# Patient Record
Sex: Male | Born: 1960 | Race: White | Hispanic: No | Marital: Married | Smoking: Never smoker
Health system: Southern US, Community
[De-identification: ages and names within clinical notes are randomized; demographics above are authoritative.]

## PROBLEM LIST (undated history)

## (undated) DIAGNOSIS — G4730 Sleep apnea, unspecified: Secondary | ICD-10-CM

## (undated) DIAGNOSIS — M1990 Unspecified osteoarthritis, unspecified site: Secondary | ICD-10-CM

## (undated) DIAGNOSIS — F419 Anxiety disorder, unspecified: Secondary | ICD-10-CM

## (undated) DIAGNOSIS — Z8739 Personal history of other diseases of the musculoskeletal system and connective tissue: Secondary | ICD-10-CM

## (undated) DIAGNOSIS — B019 Varicella without complication: Secondary | ICD-10-CM

## (undated) DIAGNOSIS — E785 Hyperlipidemia, unspecified: Secondary | ICD-10-CM

## (undated) HISTORY — PX: TOTAL HIP ARTHROPLASTY: SHX124

## (undated) HISTORY — PX: JOINT REPLACEMENT: SHX530

## (undated) HISTORY — PX: COLONOSCOPY: SHX174

## (undated) HISTORY — PX: KNEE SURGERY: SHX244

## (undated) HISTORY — PX: OTHER SURGICAL HISTORY: SHX169

---

## 2005-03-24 ENCOUNTER — Ambulatory Visit: Payer: Self-pay

## 2005-04-01 ENCOUNTER — Ambulatory Visit: Payer: Self-pay

## 2005-07-20 ENCOUNTER — Ambulatory Visit: Payer: Self-pay

## 2005-09-27 ENCOUNTER — Emergency Department: Payer: Self-pay | Admitting: Emergency Medicine

## 2006-01-21 ENCOUNTER — Ambulatory Visit: Payer: Self-pay | Admitting: Nurse Practitioner

## 2009-05-15 ENCOUNTER — Ambulatory Visit: Payer: Self-pay | Admitting: Internal Medicine

## 2010-05-22 ENCOUNTER — Ambulatory Visit: Payer: Self-pay | Admitting: Internal Medicine

## 2010-06-26 ENCOUNTER — Ambulatory Visit: Payer: Self-pay | Admitting: Internal Medicine

## 2015-10-24 ENCOUNTER — Encounter: Payer: Self-pay | Admitting: *Deleted

## 2015-10-27 ENCOUNTER — Encounter
Admission: RE | Disposition: A | Discharge status: Home / Self Care | Payer: Self-pay | Source: Ambulatory Visit | Attending: Gastroenterology

## 2015-10-27 ENCOUNTER — Ambulatory Visit
Admission: RE | Admit: 2015-10-27 | Discharge: 2015-10-27 | Disposition: A | Discharge status: Home / Self Care | Payer: BC Managed Care – PPO | Source: Ambulatory Visit | Attending: Gastroenterology | Admitting: Gastroenterology

## 2015-10-27 ENCOUNTER — Encounter: Payer: Self-pay | Admitting: *Deleted

## 2015-10-27 ENCOUNTER — Ambulatory Visit: Payer: BC Managed Care – PPO | Admitting: Anesthesiology

## 2015-10-27 DIAGNOSIS — G4730 Sleep apnea, unspecified: Secondary | ICD-10-CM | POA: Insufficient documentation

## 2015-10-27 DIAGNOSIS — Z1211 Encounter for screening for malignant neoplasm of colon: Secondary | ICD-10-CM | POA: Diagnosis not present

## 2015-10-27 DIAGNOSIS — Z96649 Presence of unspecified artificial hip joint: Secondary | ICD-10-CM | POA: Diagnosis not present

## 2015-10-27 DIAGNOSIS — F419 Anxiety disorder, unspecified: Secondary | ICD-10-CM | POA: Diagnosis not present

## 2015-10-27 DIAGNOSIS — E785 Hyperlipidemia, unspecified: Secondary | ICD-10-CM | POA: Diagnosis not present

## 2015-10-27 DIAGNOSIS — Z6838 Body mass index (BMI) 38.0-38.9, adult: Secondary | ICD-10-CM | POA: Insufficient documentation

## 2015-10-27 DIAGNOSIS — Z79899 Other long term (current) drug therapy: Secondary | ICD-10-CM | POA: Insufficient documentation

## 2015-10-27 HISTORY — DX: Hyperlipidemia, unspecified: E78.5

## 2015-10-27 HISTORY — DX: Sleep apnea, unspecified: G47.30

## 2015-10-27 HISTORY — DX: Unspecified osteoarthritis, unspecified site: M19.90

## 2015-10-27 HISTORY — DX: Anxiety disorder, unspecified: F41.9

## 2015-10-27 HISTORY — DX: Varicella without complication: B01.9

## 2015-10-27 HISTORY — PX: COLONOSCOPY WITH PROPOFOL: SHX5780

## 2015-10-27 HISTORY — DX: Personal history of other diseases of the musculoskeletal system and connective tissue: Z87.39

## 2015-10-27 SURGERY — COLONOSCOPY WITH PROPOFOL
Anesthesia: General

## 2015-10-27 MED ORDER — SODIUM CHLORIDE 0.9 % IV SOLN
INTRAVENOUS | Status: DC
  Administered 2015-10-27: 1000 mL via INTRAVENOUS

## 2015-10-27 MED ORDER — MIDAZOLAM HCL 2 MG/2ML IJ SOLN
INTRAMUSCULAR | Status: DC | PRN
  Administered 2015-10-27: 1 mg via INTRAVENOUS

## 2015-10-27 MED ORDER — SODIUM CHLORIDE 0.9 % IV SOLN
INTRAVENOUS | Status: DC
  Administered 2015-10-27: 10:00:00 via INTRAVENOUS

## 2015-10-27 MED ORDER — FENTANYL CITRATE (PF) 100 MCG/2ML IJ SOLN
INTRAMUSCULAR | Status: DC | PRN
  Administered 2015-10-27: 50 ug via INTRAVENOUS

## 2015-10-27 MED ORDER — PROPOFOL 500 MG/50ML IV EMUL
INTRAVENOUS | Status: DC | PRN
  Administered 2015-10-27: 100 ug/kg/min via INTRAVENOUS

## 2015-10-27 NOTE — Anesthesia Postprocedure Evaluation (Signed)
Anesthesia Post Note  Patient: Terry Maldonado  Procedure(s) Performed: Procedure(s) (LRB): COLONOSCOPY WITH PROPOFOL (N/A)  Patient location during evaluation: PACU Anesthesia Type: General Level of consciousness: awake Pain management: satisfactory to patient Vital Signs Assessment: post-procedure vital signs reviewed and stable Respiratory status: nonlabored ventilation Cardiovascular status: stable Anesthetic complications: no    Last Vitals:  Filed Vitals:   10/27/15 1036 10/27/15 1040  BP: 107/59 99/57  Pulse: 68 66  Temp: 36.9 C   Resp: 17 16    Last Pain: There were no vitals filed for this visit.               VAN STAVEREN,Ramiah Helfrich

## 2015-10-27 NOTE — H&P (Signed)
    Primary Care Physician:  Lynnea FerrierBERT J KLEIN III, MD Primary Gastroenterologist:  Dr. Bluford Kaufmannh  Pre-Procedure History & Physical: HPI:  Terry Phoenixhillip E Vigilante is a 55 y.o. male is here for an colonoscopy.   Past Medical History  Diagnosis Date  . Hx of degenerative disc disease   . Hyperlipidemia   . Chicken pox   . Sleep apnea   . Anxiety   . Arthritis     Past Surgical History  Procedure Laterality Date  . Knee surgery    . Colonoscopy    . Total hip arthroplasty    . Wisdom teeth removal    . Joint replacement    . Leg venous ablation      Prior to Admission medications   Medication Sig Start Date End Date Taking? Authorizing Provider  busPIRone (BUSPAR) 7.5 MG tablet Take 7.5 mg by mouth 2 (two) times daily.   Yes Historical Provider, MD  escitalopram (LEXAPRO) 10 MG tablet Take 10 mg by mouth daily.   Yes Historical Provider, MD  ibuprofen (ADVIL,MOTRIN) 800 MG tablet Take 800 mg by mouth every 8 (eight) hours as needed.   Yes Historical Provider, MD  Multiple Vitamin (MULTIVITAMIN) tablet Take 1 tablet by mouth daily.   Yes Historical Provider, MD  Omega 3 1000 MG CAPS Take 360-1,200 mg by mouth 1 day or 1 dose.   Yes Historical Provider, MD  traMADol-acetaminophen (ULTRACET) 37.5-325 MG tablet Take 1 tablet by mouth every 6 (six) hours as needed.   Yes Historical Provider, MD    Allergies as of 10/09/2015  . (Not on File)    History reviewed. No pertinent family history.  Social History   Social History  . Marital Status: Married    Spouse Name: N/A  . Number of Children: N/A  . Years of Education: N/A   Occupational History  . Not on file.   Social History Main Topics  . Smoking status: Never Smoker   . Smokeless tobacco: Not on file  . Alcohol Use: Not on file  . Drug Use: Not on file  . Sexual Activity: Not on file   Other Topics Concern  . Not on file   Social History Narrative    Review of Systems: See HPI, otherwise negative ROS  Physical  Exam: BP 142/89 mmHg  Pulse 53  Temp(Src) 97.7 F (36.5 C) (Tympanic)  Resp 18  Ht 5\' 8"  (1.727 m)  Wt 115.667 kg (255 lb)  BMI 38.78 kg/m2  SpO2 98% General:   Alert,  pleasant and cooperative in NAD Head:  Normocephalic and atraumatic. Neck:  Supple; no masses or thyromegaly. Lungs:  Clear throughout to auscultation.    Heart:  Regular rate and rhythm. Abdomen:  Soft, nontender and nondistended. Normal bowel sounds, without guarding, and without rebound.   Neurologic:  Alert and  oriented x4;  grossly normal neurologically.  Impression/Plan: Terry Maldonado is here for an colonoscopy to be performed for screening.  Risks, benefits, limitations, and alternatives regarding colonoscopy have been reviewed with the patient.  Questions have been answered.  All parties agreeable.   Darianna Amy, Ezzard StandingPAUL Y, MD  10/27/2015, 10:01 AM

## 2015-10-27 NOTE — Anesthesia Preprocedure Evaluation (Signed)
Anesthesia Evaluation  Patient identified by MRN, date of birth, ID band Patient awake    Reviewed: Allergy & Precautions, NPO status , Patient's Chart, lab work & pertinent test results  Airway Mallampati: III       Dental  (+) Teeth Intact   Pulmonary sleep apnea and Continuous Positive Airway Pressure Ventilation ,     + decreased breath sounds      Cardiovascular Exercise Tolerance: Good  Rhythm:Regular     Neuro/Psych Anxiety    GI/Hepatic negative GI ROS, Neg liver ROS,   Endo/Other  Morbid obesity  Renal/GU negative Renal ROS     Musculoskeletal   Abdominal (+) + obese,   Peds  Hematology   Anesthesia Other Findings   Reproductive/Obstetrics                             Anesthesia Physical Anesthesia Plan  ASA: III  Anesthesia Plan: General   Post-op Pain Management:    Induction: Intravenous  Airway Management Planned: Natural Airway and Nasal Cannula  Additional Equipment:   Intra-op Plan:   Post-operative Plan:   Informed Consent: I have reviewed the patients History and Physical, chart, labs and discussed the procedure including the risks, benefits and alternatives for the proposed anesthesia with the patient or authorized representative who has indicated his/her understanding and acceptance.     Plan Discussed with: CRNA  Anesthesia Plan Comments:         Anesthesia Quick Evaluation

## 2015-10-27 NOTE — Anesthesia Procedure Notes (Signed)
Performed by: Rindi Beechy Oxygen Delivery Method: Nasal cannula     

## 2015-10-27 NOTE — Transfer of Care (Signed)
Immediate Anesthesia Transfer of Care Note  Patient: Terry Maldonado  Procedure(s) Performed: Procedure(s): COLONOSCOPY WITH PROPOFOL (N/A)  Patient Location: PACU  Anesthesia Type:General  Level of Consciousness: awake and alert   Airway & Oxygen Therapy: Patient Spontanous Breathing  Post-op Assessment: Report given to RN  Post vital signs: Reviewed and stable  Last Vitals:  Filed Vitals:   10/27/15 0955 10/27/15 1036  BP: 142/89 138/75  Pulse: 53 80  Temp: 36.5 C 37 C  Resp: 18 16    Complications: No apparent anesthesia complications

## 2015-10-27 NOTE — Op Note (Signed)
[facility] Gastroenterology Patient Name: Terry Falcohillip Maser Procedure Date: 10/27/2015 10:14 AM MRN: [id number] Account #: [id number] Date of Birth: 03/25/1961 Admit Type: Outpatient Age: 55 [facility] RO[facility] 4 Gender: Male Note Status: Finalized Procedure:            Colonoscopy Indications:          Screening for colorectal malignant neoplasm Providers:            Ezzard StandingPaul Y. Bluford Kaufmannh, MD Referring MD:         Daniel NonesBert Klein, MD (Referring MD) Medicines:            Monitored Anesthesia Care Complications:        No immediate complications. Procedure:            Pre-Anesthesia Assessment:                       - Prior to the procedure, a History and Physical was                        performed, and patient medications, allergies and                        sensitivities were reviewed. The patient's tolerance of                        previous anesthesia was reviewed.                       - The risks and benefits of the procedure and the                        sedation options and risks were discussed with the                        patient. All questions were answered and informed                        consent was obtained.                       - After reviewing the risks and benefits, the patient                        was deemed in satisfactory condition to undergo the                        procedure.                       After obtaining informed consent, the colonoscope was                        passed under direct vision. Throughout the procedure,                        the patient's blood pressure, pulse, and oxygen                        saturations were monitored continuously. The  Colonoscope was introduced through the anus and                        advanced to the the cecum, identified by appendiceal                        orifice and ileocecal valve. The colonoscopy was                        performed without difficulty. The patient  tolerated the                        procedure well. The quality of the bowel preparation                        was good. Findings:      The colon (entire examined portion) appeared normal. Impression:           - The entire examined colon is normal.                       - No specimens collected. Recommendation:       - Discharge patient to home.                       - Repeat colonoscopy in 10 years for surveillance.                       - The findings and recommendations were discussed with                        the patient. Procedure Code(s):    --- Professional ---                       [phone], Colonoscopy, flexible; diagnostic, including                        collection of specimen(s) by brushing or washing, when                        performed (separate procedure) Diagnosis Code(s):    --- Professional ---                       Z12.11, Encounter for screening for malignant neoplasm                        of colon CPT copyright 2016 American Medical Association. All rights reserved. The codes documented in this report are preliminary and upon coder review may  be revised to meet current compliance requirements. Wallace Cullens, MD 10/27/2015 10:34:13 AM This report has been signed electronically. Number of Addenda: 0 Note Initiated On: 10/27/2015 10:14 AM Scope Withdrawal Time: 0 hours 6 minutes 32 seconds  Total Procedure Duration: 0 hours 9 minutes 2 seconds       [facility]

## 2015-10-28 ENCOUNTER — Encounter: Payer: Self-pay | Admitting: Gastroenterology

## 2016-07-21 ENCOUNTER — Other Ambulatory Visit: Payer: BC Managed Care – PPO

## 2016-07-21 ENCOUNTER — Encounter
Admission: RE | Admit: 2016-07-21 | Discharge: 2016-07-21 | Disposition: A | Discharge status: Home / Self Care | Payer: BC Managed Care – PPO | Source: Ambulatory Visit | Attending: Orthopedic Surgery | Admitting: Orthopedic Surgery

## 2016-07-21 DIAGNOSIS — Z01818 Encounter for other preprocedural examination: Secondary | ICD-10-CM | POA: Insufficient documentation

## 2016-07-21 DIAGNOSIS — M1612 Unilateral primary osteoarthritis, left hip: Secondary | ICD-10-CM | POA: Diagnosis not present

## 2016-07-21 LAB — SEDIMENTATION RATE: SED RATE: 5 mm/h (ref 0–20)

## 2016-07-21 LAB — TYPE AND SCREEN
ABO/RH(D): O POS
Antibody Screen: NEGATIVE

## 2016-07-21 LAB — APTT: APTT: 30 s (ref 24–36)

## 2016-07-21 LAB — SURGICAL PCR SCREEN
MRSA, PCR: NEGATIVE
STAPHYLOCOCCUS AUREUS: NEGATIVE

## 2016-07-21 LAB — PROTIME-INR
INR: 0.99
PROTHROMBIN TIME: 13.1 s (ref 11.4–15.2)

## 2016-07-21 NOTE — Patient Instructions (Signed)
  Your procedure is scheduled on: Thursday Dec.14, 2017. Report to Same Day Surgery. To find out your arrival time please call [phone] between 1PM - 3PM on Wednesday Dec. 13, 2017.  Remember: Instructions that are not followed completely may result in serious medical risk, up to and including death, or upon the discretion of your surgeon and anesthesiologist your surgery may need to be rescheduled.    _x___ 1. Do not eat food or drink liquids after midnight. No gum chewing or hard candies.     ____ 2. No Alcohol for 24 hours before or after surgery.   ____ 3. Bring all medications with you on the day of surgery if instructed.    __x__ 4. Notify your doctor if there is any change in your medical condition     (cold, fever, infections).    _____ 5. No smoking 24 hours prior to surgery.     Do not wear jewelry, make-up, hairpins, clips or nail polish.  Do not wear lotions, powders, or perfumes.   Do not shave 48 hours prior to surgery. Men may shave face and neck.  Do not bring valuables to the hospital.    [facility] is not responsible for any belongings or valuables.               Contacts, dentures or bridgework may not be worn into surgery.  Leave your suitcase in the car. After surgery it may be brought to your room.  For patients admitted to the hospital, discharge time is determined by your treatment team.   Patients discharged the day of surgery will not be allowed to drive home.    Please read over the following fact sheets that you were given:   [facility] Preparing for [facility]  _x___ Take these medicines the morning of surgery with A SIP OF WATER:    1. escitalopram (LEXAPRO)    ____ Fleet Enema (as directed)   __x__ Use CHG Soap as directed on instruction sheet  ____ Use inhalers on the day of surgery and bring to hospital day of surgery  ____ Stop metformin 2 days prior to surgery    ____ Take 1/2 of usual insulin dose the night before surgery and none  on the morning of surgery.   _x___ Stop aspirin 7 days prior to surgery.  __x__ Stop Anti-inflammatories such as Advil, Aleve, Ibuprofen, Motrin, Naproxen, Naprosyn, Goodies powders or aspirin products 7 days prior to surgery.. OK to  take Tylenol or Tramadol.   __x__ Stop supplements:Omega 3  until after surgery.    _x___ Bring C-Pap to the hospital.

## 2016-07-21 NOTE — Pre-Procedure Instructions (Signed)
See EKG on chart Sinus Huston FoleyBrady.

## 2016-07-21 NOTE — Pre-Procedure Instructions (Signed)
Pt saw his PCP last week at [facility], had CBC, Met B, UA and EKG done.  Spoke with Mendel Ryder at Dr. Rudene Christians office to report above, OK not to repeat above tests at today's PAT visit and use results from last week for upcoming surgery.

## 2016-07-22 LAB — URINALYSIS, ROUTINE W REFLEX MICROSCOPIC
BILIRUBIN URINE: NEGATIVE
Bacteria, UA: NONE SEEN
GLUCOSE, UA: NEGATIVE mg/dL
HGB URINE DIPSTICK: NEGATIVE
Ketones, ur: NEGATIVE mg/dL
LEUKOCYTES UA: NEGATIVE
NITRITE: NEGATIVE
PH: 5 (ref 5.0–8.0)
Protein, ur: 30 mg/dL — AB
SPECIFIC GRAVITY, URINE: 1.028 (ref 1.005–1.030)
Squamous Epithelial / LPF: NONE SEEN

## 2016-07-22 LAB — URINE CULTURE: CULTURE: NO GROWTH

## 2016-07-29 ENCOUNTER — Inpatient Hospital Stay: Payer: BC Managed Care – PPO | Admitting: Anesthesiology

## 2016-07-29 ENCOUNTER — Encounter: Payer: Self-pay | Admitting: *Deleted

## 2016-07-29 ENCOUNTER — Inpatient Hospital Stay
Admission: RE | Admit: 2016-07-29 | Discharge: 2016-07-31 | DRG: 470 | Disposition: A | Discharge status: Home Health | Payer: BC Managed Care – PPO | Source: Ambulatory Visit | Attending: Orthopedic Surgery | Admitting: Orthopedic Surgery

## 2016-07-29 ENCOUNTER — Inpatient Hospital Stay: Payer: BC Managed Care – PPO

## 2016-07-29 ENCOUNTER — Encounter
Admission: RE | Disposition: A | Discharge status: Home Health | Payer: Self-pay | Source: Ambulatory Visit | Attending: Orthopedic Surgery

## 2016-07-29 DIAGNOSIS — F17220 Nicotine dependence, chewing tobacco, uncomplicated: Secondary | ICD-10-CM | POA: Diagnosis present

## 2016-07-29 DIAGNOSIS — M6281 Muscle weakness (generalized): Secondary | ICD-10-CM

## 2016-07-29 DIAGNOSIS — G8918 Other acute postprocedural pain: Secondary | ICD-10-CM

## 2016-07-29 DIAGNOSIS — R262 Difficulty in walking, not elsewhere classified: Secondary | ICD-10-CM

## 2016-07-29 DIAGNOSIS — G4730 Sleep apnea, unspecified: Secondary | ICD-10-CM | POA: Diagnosis present

## 2016-07-29 DIAGNOSIS — F419 Anxiety disorder, unspecified: Secondary | ICD-10-CM | POA: Diagnosis present

## 2016-07-29 DIAGNOSIS — E785 Hyperlipidemia, unspecified: Secondary | ICD-10-CM | POA: Diagnosis present

## 2016-07-29 DIAGNOSIS — D62 Acute posthemorrhagic anemia: Secondary | ICD-10-CM | POA: Diagnosis not present

## 2016-07-29 DIAGNOSIS — Z888 Allergy status to other drugs, medicaments and biological substances status: Secondary | ICD-10-CM

## 2016-07-29 DIAGNOSIS — M1612 Unilateral primary osteoarthritis, left hip: Secondary | ICD-10-CM | POA: Diagnosis present

## 2016-07-29 DIAGNOSIS — E876 Hypokalemia: Secondary | ICD-10-CM | POA: Diagnosis present

## 2016-07-29 DIAGNOSIS — Z419 Encounter for procedure for purposes other than remedying health state, unspecified: Secondary | ICD-10-CM

## 2016-07-29 DIAGNOSIS — R509 Fever, unspecified: Secondary | ICD-10-CM | POA: Diagnosis not present

## 2016-07-29 HISTORY — PX: TOTAL HIP ARTHROPLASTY: SHX124

## 2016-07-29 LAB — CBC
HEMATOCRIT: 39.3 % — AB (ref 40.0–52.0)
Hemoglobin: 13.5 g/dL (ref 13.0–18.0)
MCH: 30 pg (ref 26.0–34.0)
MCHC: 34.3 g/dL (ref 32.0–36.0)
MCV: 87.3 fL (ref 80.0–100.0)
PLATELETS: 185 10*3/uL (ref 150–440)
RBC: 4.51 MIL/uL (ref 4.40–5.90)
RDW: 13.3 % (ref 11.5–14.5)
WBC: 19.9 10*3/uL — AB (ref 3.8–10.6)

## 2016-07-29 LAB — ABO/RH: ABO/RH(D): O POS

## 2016-07-29 LAB — CREATININE, SERUM: CREATININE: 0.9 mg/dL (ref 0.61–1.24)

## 2016-07-29 SURGERY — ARTHROPLASTY, HIP, TOTAL, ANTERIOR APPROACH
Anesthesia: General | Site: Hip | Laterality: Left | Wound class: Clean

## 2016-07-29 MED ORDER — ACETAMINOPHEN 10 MG/ML IV SOLN
INTRAVENOUS | Status: AC
  Administered 2016-07-29: 1000 mg via INTRAVENOUS
  Filled 2016-07-29: qty 100

## 2016-07-29 MED ORDER — DIPHENHYDRAMINE HCL 12.5 MG/5ML PO ELIX: 12.5000 mg | ORAL_SOLUTION | ORAL | Status: DC | PRN

## 2016-07-29 MED ORDER — SODIUM CHLORIDE 0.9 % IV SOLN
INTRAVENOUS | Status: DC
Start: 2016-07-29 — End: 2016-07-31
  Administered 2016-07-29 (×2): via INTRAVENOUS

## 2016-07-29 MED ORDER — FENTANYL CITRATE (PF) 100 MCG/2ML IJ SOLN
INTRAMUSCULAR | Status: DC | PRN
  Administered 2016-07-29: 100 ug via INTRAVENOUS

## 2016-07-29 MED ORDER — DEXTROSE 5 % IV SOLN
3.0000 g | Freq: Four times a day (QID) | INTRAVENOUS | Status: AC
  Administered 2016-07-29 – 2016-07-30 (×3): 3 g via INTRAVENOUS
  Filled 2016-07-29 (×3): qty 3000

## 2016-07-29 MED ORDER — DEXTROSE 5 % IV SOLN
3.0000 g | Freq: Once | INTRAVENOUS | Status: DC
  Filled 2016-07-29: qty 3000

## 2016-07-29 MED ORDER — SUGAMMADEX SODIUM 200 MG/2ML IV SOLN
INTRAVENOUS | Status: DC | PRN
  Administered 2016-07-29: 200 mg via INTRAVENOUS

## 2016-07-29 MED ORDER — FENTANYL CITRATE (PF) 100 MCG/2ML IJ SOLN
INTRAMUSCULAR | Status: AC
  Filled 2016-07-29: qty 2

## 2016-07-29 MED ORDER — ZOLPIDEM TARTRATE 5 MG PO TABS: 5.0000 mg | ORAL_TABLET | Freq: Every evening | ORAL | Status: DC | PRN

## 2016-07-29 MED ORDER — DEXAMETHASONE SODIUM PHOSPHATE 10 MG/ML IJ SOLN
INTRAMUSCULAR | Status: DC | PRN
  Administered 2016-07-29: 4 mg via INTRAVENOUS

## 2016-07-29 MED ORDER — FAMOTIDINE 20 MG PO TABS
ORAL_TABLET | ORAL | Status: AC
  Filled 2016-07-29: qty 1

## 2016-07-29 MED ORDER — BUPIVACAINE-EPINEPHRINE (PF) 0.25% -1:200000 IJ SOLN
INTRAMUSCULAR | Status: AC
  Filled 2016-07-29: qty 30

## 2016-07-29 MED ORDER — ADULT MULTIVITAMIN W/MINERALS CH
1.0000 | ORAL_TABLET | Freq: Every day | ORAL | Status: DC
  Administered 2016-07-29 – 2016-07-31 (×3): 1 via ORAL
  Filled 2016-07-29 (×3): qty 1

## 2016-07-29 MED ORDER — ROCURONIUM BROMIDE 100 MG/10ML IV SOLN
INTRAVENOUS | Status: DC | PRN
  Administered 2016-07-29: 10 mg via INTRAVENOUS
  Administered 2016-07-29: 20 mg via INTRAVENOUS

## 2016-07-29 MED ORDER — EPHEDRINE SULFATE 50 MG/ML IJ SOLN
INTRAMUSCULAR | Status: DC | PRN
Start: 2016-07-29 — End: 2016-07-29
  Administered 2016-07-29: 15 mg via INTRAVENOUS
  Administered 2016-07-29 (×3): 10 mg via INTRAVENOUS

## 2016-07-29 MED ORDER — MORPHINE SULFATE (PF) 2 MG/ML IV SOLN
2.0000 mg | INTRAVENOUS | Status: DC | PRN
  Administered 2016-07-29 – 2016-07-30 (×2): 2 mg via INTRAVENOUS
  Filled 2016-07-29 (×2): qty 1

## 2016-07-29 MED ORDER — OMEGA-3-ACID ETHYL ESTERS 1 G PO CAPS
1000.0000 mg | ORAL_CAPSULE | ORAL | Status: DC
  Administered 2016-07-31: 1000 mg via ORAL
  Filled 2016-07-29: qty 1

## 2016-07-29 MED ORDER — FENTANYL CITRATE (PF) 100 MCG/2ML IJ SOLN
25.0000 ug | INTRAMUSCULAR | Status: DC | PRN
  Administered 2016-07-29 (×5): 25 ug via INTRAVENOUS

## 2016-07-29 MED ORDER — LIDOCAINE HCL (CARDIAC) 20 MG/ML IV SOLN
INTRAVENOUS | Status: DC | PRN
  Administered 2016-07-29: 100 mg via INTRAVENOUS

## 2016-07-29 MED ORDER — ALUM & MAG HYDROXIDE-SIMETH 200-200-20 MG/5ML PO SUSP: 30.0000 mL | ORAL | Status: DC | PRN

## 2016-07-29 MED ORDER — PROMETHAZINE HCL 25 MG/ML IJ SOLN: 6.2500 mg | INTRAMUSCULAR | Status: DC | PRN

## 2016-07-29 MED ORDER — ASPIRIN EC 81 MG PO TBEC
81.0000 mg | DELAYED_RELEASE_TABLET | Freq: Every day | ORAL | Status: DC
  Administered 2016-07-29 – 2016-07-31 (×3): 81 mg via ORAL
  Filled 2016-07-29 (×3): qty 1

## 2016-07-29 MED ORDER — FAMOTIDINE 20 MG PO TABS
20.0000 mg | ORAL_TABLET | Freq: Once | ORAL | Status: AC
  Administered 2016-07-29: 20 mg via ORAL

## 2016-07-29 MED ORDER — BUPIVACAINE-EPINEPHRINE 0.25% -1:200000 IJ SOLN
INTRAMUSCULAR | Status: DC | PRN
  Administered 2016-07-29: 30 mL

## 2016-07-29 MED ORDER — METOCLOPRAMIDE HCL 5 MG/ML IJ SOLN: 5.0000 mg | Freq: Three times a day (TID) | INTRAMUSCULAR | Status: DC | PRN

## 2016-07-29 MED ORDER — ONDANSETRON HCL 4 MG/2ML IJ SOLN: 4.0000 mg | Freq: Four times a day (QID) | INTRAMUSCULAR | Status: DC | PRN

## 2016-07-29 MED ORDER — GENTAMICIN SULFATE 40 MG/ML IJ SOLN
INTRAMUSCULAR | Status: AC
Start: 2016-07-29 — End: 2016-07-29
  Filled 2016-07-29: qty 2

## 2016-07-29 MED ORDER — ONDANSETRON HCL 4 MG/2ML IJ SOLN
INTRAMUSCULAR | Status: DC | PRN
  Administered 2016-07-29: 4 mg via INTRAVENOUS

## 2016-07-29 MED ORDER — MAGNESIUM CITRATE PO SOLN
1.0000 | Freq: Once | ORAL | Status: DC | PRN
  Filled 2016-07-29: qty 296

## 2016-07-29 MED ORDER — OXYCODONE HCL 5 MG PO TABS
5.0000 mg | ORAL_TABLET | ORAL | Status: DC | PRN
  Administered 2016-07-29 – 2016-07-31 (×10): 10 mg via ORAL
  Filled 2016-07-29 (×10): qty 2

## 2016-07-29 MED ORDER — NEOMYCIN-POLYMYXIN B GU 40-200000 IR SOLN
Status: DC | PRN
  Administered 2016-07-29: 4 mL

## 2016-07-29 MED ORDER — BISACODYL 10 MG RE SUPP: 10.0000 mg | Freq: Every day | RECTAL | Status: DC | PRN

## 2016-07-29 MED ORDER — MAGNESIUM HYDROXIDE 400 MG/5ML PO SUSP
30.0000 mL | Freq: Every day | ORAL | Status: DC | PRN
  Administered 2016-07-30: 30 mL via ORAL
  Filled 2016-07-29: qty 30

## 2016-07-29 MED ORDER — TRANEXAMIC ACID 1000 MG/10ML IV SOLN
1000.0000 mg | INTRAVENOUS | Status: AC
  Administered 2016-07-29: 1000 mg via INTRAVENOUS
  Filled 2016-07-29: qty 10

## 2016-07-29 MED ORDER — LACTATED RINGERS IV SOLN
INTRAVENOUS | Status: DC
  Administered 2016-07-29 (×2): via INTRAVENOUS

## 2016-07-29 MED ORDER — DOCUSATE SODIUM 100 MG PO CAPS
100.0000 mg | ORAL_CAPSULE | Freq: Two times a day (BID) | ORAL | Status: DC
  Administered 2016-07-29 – 2016-07-31 (×5): 100 mg via ORAL
  Filled 2016-07-29 (×5): qty 1

## 2016-07-29 MED ORDER — HYDROMORPHONE HCL 1 MG/ML IJ SOLN
INTRAMUSCULAR | Status: DC | PRN
  Administered 2016-07-29: .6 mg via INTRAVENOUS
  Administered 2016-07-29: .4 mg via INTRAVENOUS
  Administered 2016-07-29: .5 mg via INTRAVENOUS

## 2016-07-29 MED ORDER — NEOMYCIN-POLYMYXIN B GU 40-200000 IR SOLN
Status: AC
  Filled 2016-07-29: qty 4

## 2016-07-29 MED ORDER — ACETAMINOPHEN 650 MG RE SUPP: 650.0000 mg | Freq: Four times a day (QID) | RECTAL | Status: DC | PRN

## 2016-07-29 MED ORDER — ENOXAPARIN SODIUM 40 MG/0.4ML ~~LOC~~ SOLN
40.0000 mg | SUBCUTANEOUS | Status: DC
  Administered 2016-07-30 – 2016-07-31 (×2): 40 mg via SUBCUTANEOUS
  Filled 2016-07-29 (×2): qty 0.4

## 2016-07-29 MED ORDER — FENTANYL CITRATE (PF) 100 MCG/2ML IJ SOLN
INTRAMUSCULAR | Status: AC
  Administered 2016-07-29: 25 ug via INTRAVENOUS
  Filled 2016-07-29: qty 2

## 2016-07-29 MED ORDER — PHENOL 1.4 % MT LIQD
1.0000 | OROMUCOSAL | Status: DC | PRN
  Filled 2016-07-29: qty 177

## 2016-07-29 MED ORDER — ONDANSETRON HCL 4 MG PO TABS: 4.0000 mg | ORAL_TABLET | Freq: Four times a day (QID) | ORAL | Status: DC | PRN

## 2016-07-29 MED ORDER — ACETAMINOPHEN 325 MG PO TABS
650.0000 mg | ORAL_TABLET | Freq: Four times a day (QID) | ORAL | Status: DC | PRN
  Administered 2016-07-30: 650 mg via ORAL

## 2016-07-29 MED ORDER — MENTHOL 3 MG MT LOZG
1.0000 | LOZENGE | OROMUCOSAL | Status: DC | PRN
  Filled 2016-07-29: qty 9

## 2016-07-29 MED ORDER — ESCITALOPRAM OXALATE 10 MG PO TABS
10.0000 mg | ORAL_TABLET | Freq: Every day | ORAL | Status: DC
  Administered 2016-07-31: 10 mg via ORAL
  Filled 2016-07-29 (×3): qty 1

## 2016-07-29 MED ORDER — PROPOFOL 10 MG/ML IV BOLUS
INTRAVENOUS | Status: DC | PRN
  Administered 2016-07-29: 200 mg via INTRAVENOUS

## 2016-07-29 MED ORDER — METOCLOPRAMIDE HCL 10 MG PO TABS
5.0000 mg | ORAL_TABLET | Freq: Three times a day (TID) | ORAL | Status: DC | PRN
  Administered 2016-07-29 – 2016-07-30 (×2): 10 mg via ORAL
  Filled 2016-07-29 (×2): qty 1

## 2016-07-29 MED ORDER — MIDAZOLAM HCL 5 MG/5ML IJ SOLN
INTRAMUSCULAR | Status: DC | PRN
  Administered 2016-07-29 (×2): 1 mg via INTRAVENOUS

## 2016-07-29 MED ORDER — KETOROLAC TROMETHAMINE 30 MG/ML IJ SOLN
INTRAMUSCULAR | Status: DC | PRN
  Administered 2016-07-29: 30 mg via INTRAVENOUS

## 2016-07-29 MED ORDER — ACETAMINOPHEN 10 MG/ML IV SOLN
1000.0000 mg | Freq: Once | INTRAVENOUS | Status: AC
  Administered 2016-07-29: 1000 mg via INTRAVENOUS

## 2016-07-29 MED ORDER — CEFAZOLIN SODIUM 1 G IJ SOLR
INTRAMUSCULAR | Status: DC | PRN
  Administered 2016-07-29: 3 g via INTRAMUSCULAR

## 2016-07-29 SURGICAL SUPPLY — 48 items
BLADE SAW SAG 18.5X105 (BLADE) ×3 IMPLANT
BNDG COHESIVE 6X5 TAN STRL LF (GAUZE/BANDAGES/DRESSINGS) ×6 IMPLANT
CANISTER SUCT 1200ML W/VALVE (MISCELLANEOUS) ×3 IMPLANT
CAPT HIP TOTAL 3 ×3 IMPLANT
CATH FOL LEG HOLDER (MISCELLANEOUS) ×3 IMPLANT
CATH TRAY METER 16FR LF (MISCELLANEOUS) ×3 IMPLANT
CHLORAPREP W/TINT 26ML (MISCELLANEOUS) ×3 IMPLANT
DRAPE C-ARM XRAY 36X54 (DRAPES) ×3 IMPLANT
DRAPE INCISE IOBAN 66X60 STRL (DRAPES) IMPLANT
DRAPE POUCH INSTRU U-SHP 10X18 (DRAPES) ×3 IMPLANT
DRAPE SHEET LG 3/4 BI-LAMINATE (DRAPES) ×9 IMPLANT
DRAPE STERI IOBAN 125X83 (DRAPES) ×3 IMPLANT
DRAPE TABLE BACK 80X90 (DRAPES) ×3 IMPLANT
DRSG OPSITE POSTOP 4X8 (GAUZE/BANDAGES/DRESSINGS) ×6 IMPLANT
ELECT BLADE 6.5 EXT (BLADE) ×3 IMPLANT
ELECT REM PT RETURN 9FT ADLT (ELECTROSURGICAL) ×3
ELECTRODE REM PT RTRN 9FT ADLT (ELECTROSURGICAL) ×1 IMPLANT
GAUZE SPONGE 4X4 12PLY STRL (GAUZE/BANDAGES/DRESSINGS) ×3 IMPLANT
GLOVE BIOGEL PI IND STRL 9 (GLOVE) ×1 IMPLANT
GLOVE BIOGEL PI INDICATOR 9 (GLOVE) ×2
GLOVE SURG SYN 9.0  PF PI (GLOVE) ×4
GLOVE SURG SYN 9.0 PF PI (GLOVE) ×2 IMPLANT
GOWN SRG 2XL LVL 4 RGLN SLV (GOWNS) ×1 IMPLANT
GOWN STRL NON-REIN 2XL LVL4 (GOWNS) ×2
GOWN STRL REUS W/ TWL LRG LVL3 (GOWN DISPOSABLE) ×1 IMPLANT
GOWN STRL REUS W/TWL LRG LVL3 (GOWN DISPOSABLE) ×2
HEMOVAC 400CC 10FR (MISCELLANEOUS) IMPLANT
HOOD PEEL AWAY FLYTE STAYCOOL (MISCELLANEOUS) ×3 IMPLANT
MAT BLUE FLOOR 46X72 FLO (MISCELLANEOUS) ×3 IMPLANT
NDL SAFETY 18GX1.5 (NEEDLE) ×3 IMPLANT
NEEDLE SPNL 18GX3.5 QUINCKE PK (NEEDLE) ×3 IMPLANT
NS IRRIG 1000ML POUR BTL (IV SOLUTION) ×3 IMPLANT
PACK HIP COMPR (MISCELLANEOUS) ×3 IMPLANT
SOL PREP PVP 2OZ (MISCELLANEOUS) ×3
SOLUTION PREP PVP 2OZ (MISCELLANEOUS) ×1 IMPLANT
STAPLER SKIN PROX 35W (STAPLE) ×3 IMPLANT
STRAP SAFETY BODY (MISCELLANEOUS) ×3 IMPLANT
SUT DVC 2 QUILL PDO  T11 36X36 (SUTURE) ×2
SUT DVC 2 QUILL PDO T11 36X36 (SUTURE) ×1 IMPLANT
SUT SILK 0 (SUTURE) ×2
SUT SILK 0 30XBRD TIE 6 (SUTURE) ×1 IMPLANT
SUT V-LOC 90 ABS DVC 3-0 CL (SUTURE) ×3 IMPLANT
SUT VIC AB 1 CT1 36 (SUTURE) ×3 IMPLANT
SYR 20CC LL (SYRINGE) ×3 IMPLANT
SYR 30ML LL (SYRINGE) ×3 IMPLANT
TAPE MICROFOAM 4IN (TAPE) ×3 IMPLANT
TOWEL OR 17X26 4PK STRL BLUE (TOWEL DISPOSABLE) ×3 IMPLANT
TUBE KAMVAC SUCTION (TUBING) IMPLANT

## 2016-07-29 NOTE — H&P (Signed)
Reviewed paper H+P, will be scanned into chart. No changes noted.  

## 2016-07-29 NOTE — Anesthesia Preprocedure Evaluation (Signed)
Anesthesia Evaluation  Patient identified by MRN, date of birth, ID band Patient awake    Reviewed: Allergy & Precautions, H&P , NPO status , Patient's Chart, lab work & pertinent test results, reviewed documented beta blocker date and time   History of Anesthesia Complications Negative for: history of anesthetic complications  Airway Mallampati: I  TM Distance: >3 FB Neck ROM: full    Dental  (+) Caps Permanent bridge x2:   Pulmonary neg shortness of breath, sleep apnea and Continuous Positive Airway Pressure Ventilation , neg recent URI,    Pulmonary exam normal breath sounds clear to auscultation       Cardiovascular Exercise Tolerance: Good negative cardio ROS Normal cardiovascular exam Rhythm:regular Rate:Normal     Neuro/Psych negative neurological ROS  negative psych ROS   GI/Hepatic negative GI ROS, Neg liver ROS,   Endo/Other  negative endocrine ROS  Renal/GU negative Renal ROS  negative genitourinary   Musculoskeletal   Abdominal   Peds  Hematology negative hematology ROS (+)   Anesthesia Other Findings Past Medical History: No date: Anxiety No date: Arthritis No date: Chicken pox No date: Hx of degenerative disc disease No date: Hyperlipidemia No date: Sleep apnea   Reproductive/Obstetrics negative OB ROS                             Anesthesia Physical Anesthesia Plan  ASA: II  Anesthesia Plan: Spinal   Post-op Pain Management:    Induction:   Airway Management Planned:   Additional Equipment:   Intra-op Plan:   Post-operative Plan:   Informed Consent: I have reviewed the patients History and Physical, chart, labs and discussed the procedure including the risks, benefits and alternatives for the proposed anesthesia with the patient or authorized representative who has indicated his/her understanding and acceptance.   Dental Advisory Given  Plan Discussed  with: Anesthesiologist, CRNA and Surgeon  Anesthesia Plan Comments:         Anesthesia Quick Evaluation

## 2016-07-29 NOTE — Transfer of Care (Signed)
Immediate Anesthesia Transfer of Care Note  Patient: Terry Maldonado  Procedure(s) Performed: Procedure(s): TOTAL HIP ARTHROPLASTY ANTERIOR APPROACH (Left)  Patient Location: PACU  Anesthesia Type:General  Level of Consciousness: awake, alert  and oriented  Airway & Oxygen Therapy: Patient Spontanous Breathing and Patient connected to face mask oxygen  Post-op Assessment: Report given to RN and Post -op Vital signs reviewed and stable  Post vital signs: Reviewed and stable  Last Vitals:  Vitals:   07/29/16 0617  BP: (!) 148/80  Pulse: (!) 57  Resp: 16  Temp: 36.8 C    Last Pain:  Vitals:   07/29/16 0617  TempSrc: Oral  PainSc: 5          Complications: No apparent anesthesia complications

## 2016-07-29 NOTE — NC FL2 (Signed)
[place] MEDICAID FL2 LEVEL OF CARE SCREENING TOOL     IDENTIFICATION  Patient Name: Terry Maldonado Birthdate: 07/25/1961 Sex: male Admission Date (Current Location): 07/29/2016  [place]   Facility and Address:  [facility], [address], [place], [place] [address]      Provider Number: [id number]  Attending Physician Name and Address:  Kennedy BuckerMichael Menz, MD  Relative Name and Phone Number:       Current Level of Care: Hospital Recommended Level of Care: Skilled Nursing Facility Prior Approval Number:    Date Approved/Denied:   PASRR Number:  [id number])  Discharge Plan: SNF    Current Diagnoses: Patient Active Problem List   Diagnosis Date Noted  . Primary localized osteoarthritis of left hip 07/29/2016   Arthritis 07/18/2015  Hyperlipidemia, unspecified   Sleep apnea      Orientation RESPIRATION BLADDER Height & Weight     Self, Time, Situation, Place  Normal Continent Weight: 258 lb (117 kg) Height:  5\' 8"  (172.7 cm)  BEHAVIORAL SYMPTOMS/MOOD NEUROLOGICAL BOWEL NUTRITION STATUS   (none)  (none) Continent Diet (Diet: Clear Liquid )  AMBULATORY STATUS COMMUNICATION OF NEEDS Skin   Extensive Assist Verbally Surgical wounds (Incision: Left Hip. )                       Personal Care Assistance Level of Assistance  Bathing, Feeding, Dressing Bathing Assistance: Limited assistance Feeding assistance: Independent Dressing Assistance: Limited assistance     Functional Limitations Info  Sight, Hearing, Speech Sight Info: Adequate Hearing Info: Adequate Speech Info: Adequate    SPECIAL CARE FACTORS FREQUENCY  PT (By licensed PT), OT (By licensed OT)     PT Frequency:  (5) OT Frequency:  (5)            Contractures      Additional Factors Info  Code Status, Allergies Code Status Info:  (Full Code. ) Allergies Info:  (Neomycin Sulfate Neomycin)           Current Medications  (07/29/2016):  This is the current hospital active medication list Current Facility-Administered Medications  Medication Dose Route Frequency Provider Last Rate Last Dose  . 0.9 %  sodium chloride infusion   Intravenous Continuous Kennedy BuckerMichael Menz, MD 100 mL/hr at 07/29/16 1219    . acetaminophen (TYLENOL) tablet 650 mg  650 mg Oral Q6H PRN Kennedy BuckerMichael Menz, MD       Or  . acetaminophen (TYLENOL) suppository 650 mg  650 mg Rectal Q6H PRN Kennedy BuckerMichael Menz, MD      . alum & mag hydroxide-simeth (MAALOX/MYLANTA) 200-200-20 MG/5ML suspension 30 mL  30 mL Oral Q4H PRN Kennedy BuckerMichael Menz, MD      . aspirin EC tablet 81 mg  81 mg Oral Daily Kennedy BuckerMichael Menz, MD   81 mg at 07/29/16 1218  . bisacodyl (DULCOLAX) suppository 10 mg  10 mg Rectal Daily PRN Kennedy BuckerMichael Menz, MD      . ceFAZolin (ANCEF) 3 g in dextrose 5 % 50 mL IVPB  3 g Intravenous Q6H Kennedy BuckerMichael Menz, MD      . diphenhydrAMINE (BENADRYL) 12.5 MG/5ML elixir 12.5-25 mg  12.5-25 mg Oral Q4H PRN Kennedy BuckerMichael Menz, MD      . docusate sodium (COLACE) capsule 100 mg  100 mg Oral BID Kennedy BuckerMichael Menz, MD   100 mg at 07/29/16 1218  . [START ON 07/30/2016] enoxaparin (LOVENOX) injection 40 mg  40 mg Subcutaneous Q24H Kennedy BuckerMichael Menz, MD      .  escitalopram (LEXAPRO) tablet 10 mg  10 mg Oral Daily Kennedy BuckerMichael Menz, MD      . famotidine (PEPCID) 20 MG tablet           . fentaNYL (SUBLIMAZE) 100 MCG/2ML injection           . magnesium citrate solution 1 Bottle  1 Bottle Oral Once PRN Kennedy BuckerMichael Menz, MD      . magnesium hydroxide (MILK OF MAGNESIA) suspension 30 mL  30 mL Oral Daily PRN Kennedy BuckerMichael Menz, MD      . menthol-cetylpyridinium (CEPACOL) lozenge 3 mg  1 lozenge Oral PRN Kennedy BuckerMichael Menz, MD       Or  . phenol (CHLORASEPTIC) mouth spray 1 spray  1 spray Mouth/Throat PRN Kennedy BuckerMichael Menz, MD      . metoCLOPramide (REGLAN) tablet 5-10 mg  5-10 mg Oral Q8H PRN Kennedy BuckerMichael Menz, MD   10 mg at 07/29/16 1218   Or  . metoCLOPramide (REGLAN) injection 5-10 mg  5-10 mg Intravenous Q8H PRN Kennedy BuckerMichael Menz, MD      .  morphine 2 MG/ML injection 2 mg  2 mg Intravenous Q1H PRN Kennedy BuckerMichael Menz, MD      . multivitamin with minerals tablet 1 tablet  1 tablet Oral Daily Kennedy BuckerMichael Menz, MD   1 tablet at 07/29/16 1217  . [START ON 07/31/2016] omega-3 acid ethyl esters (LOVAZA) capsule 1,000 mg  1,000 mg Oral Q Sat Kennedy BuckerMichael Menz, MD      . ondansetron [facility](ZOFRAN) tablet 4 mg  4 mg Oral Q6H PRN Kennedy BuckerMichael Menz, MD       Or  . ondansetron [facility](ZOFRAN) injection 4 mg  4 mg Intravenous Q6H PRN Kennedy BuckerMichael Menz, MD      . oxyCODONE (Oxy IR/ROXICODONE) immediate release tablet 5-10 mg  5-10 mg Oral Q3H PRN Kennedy BuckerMichael Menz, MD   10 mg at 07/29/16 1217  . zolpidem (AMBIEN) tablet 5 mg  5 mg Oral QHS PRN,MR X 1 Kennedy BuckerMichael Menz, MD         Discharge Medications: Please see discharge summary for a list of discharge medications.  Relevant Imaging Results:  Relevant Lab Results:   Additional Information  (SSN: [id number])  Brunetta Newingham, Darleen CrockerBailey M, LCSW

## 2016-07-29 NOTE — Transfer of Care (Signed)
Immediate Anesthesia Transfer of Care Note  Patient: Terry Maldonado  Procedure(s) Performed: Procedure(s): TOTAL HIP ARTHROPLASTY ANTERIOR APPROACH (Left)  Patient Location: PACU  Anesthesia Type:General  Level of Consciousness: awake, alert  and oriented  Airway & Oxygen Therapy: Patient Spontanous Breathing and Patient connected to face mask oxygen  Post-op Assessment: Report given to RN and Post -op Vital signs reviewed and stable  Post vital signs: Reviewed and stable  Last Vitals:  Vitals:   07/29/16 0617 07/29/16 0950  BP: (!) 148/80 (!) 137/123  Pulse: (!) 57 63  Resp: 16 14  Temp: 36.8 C 36.3 C    Last Pain:  Vitals:   07/29/16 0617  TempSrc: Oral  PainSc: 5          Complications: No apparent anesthesia complications

## 2016-07-29 NOTE — Anesthesia Procedure Notes (Signed)
Procedure Name: Intubation Date/Time: 07/29/2016 8:00 AM Performed by: Almeta MonasFLETCHER, Chivonne Rascon Pre-anesthesia Checklist: Patient identified, Patient being monitored, Timeout performed, Emergency Drugs available and Suction available Patient Re-evaluated:Patient Re-evaluated prior to inductionOxygen Delivery Method: Circle system utilized Preoxygenation: Pre-oxygenation with 100% oxygen Intubation Type: IV induction Ventilation: Mask ventilation without difficulty Laryngoscope Size: Mac and 3 Grade View: Grade II Tube type: Oral Tube size: 7.0 mm Number of attempts: 1 Airway Equipment and Method: Stylet Placement Confirmation: ETT inserted through vocal cords under direct vision,  positive ETCO2 and breath sounds checked- equal and bilateral Secured at: 21 cm Tube secured with: Tape Dental Injury: Teeth and Oropharynx as per pre-operative assessment

## 2016-07-29 NOTE — Evaluation (Signed)
Physical Therapy Evaluation Patient Details Name: Terry Maldonado MRN: [id number] DOB: 09/10/1960 Today's Date: 07/29/2016   History of Present Illness  Pt is a 55 y/o male s/p total hip - anterior approach (12/14)  Clinical Impression  Pt did very well post-op day 0 with ability to walk a good distance with consistent gait pattern, AROM SLRs, and not needing direct assist with mobility and transfers.  Pt very motivated and willing to participate and ultimately he is progressing as good as can be expected the day of surgery.      Follow Up Recommendations Home health PT    Equipment Recommendations       Recommendations for Other Services       Precautions / Restrictions Precautions Precautions: Anterior Hip Restrictions Weight Bearing Restrictions: Yes LLE Weight Bearing: Weight bearing as tolerated      Mobility  Bed Mobility Overal bed mobility: Modified Independent             General bed mobility comments: Pt was able to rise to EOB without direct assist  Transfers Overall transfer level: Modified independent Equipment used: Rolling walker (2 wheeled)             General transfer comment: Pt able to rise to standing with only minimal cuing for hand placement and sequencing  Ambulation/Gait Ambulation/Gait assistance: Supervision Ambulation Distance (Feet): 35 Feet Assistive device: Rolling walker (2 wheeled)       General Gait Details: Pt was able to ambulate with consistent walker movement and good relative confidence and UE use.  Stairs            Wheelchair Mobility    Modified Rankin (Stroke Patients Only)       Balance Overall balance assessment: Modified Independent                                           Pertinent Vitals/Pain Pain Assessment: 0-10 Pain Score: 4     Home Living Family/patient expects to be discharged to:: Private residence Living Arrangements: Spouse/significant other Available Help  at Discharge: Family   Home Access: Stairs to enter Entrance Stairs-Rails: LawyerLeft;Right Entrance Stairs-Number of Steps: 3          Prior Function Level of Independence: Independent         Comments: works in a Wellsite geologistphyiscally demanding job, no issues     Higher education careers adviserHand Dominance        Extremity/Trunk Assessment   Upper Extremity Assessment Upper Extremity Assessment: Overall WFL for tasks assessed    Lower Extremity Assessment Lower Extremity Assessment: Overall WFL for tasks assessed (L hip able to do SLRs, shows grossly 4/5 strength )       Communication   Communication: No difficulties  Cognition Arousal/Alertness: Awake/alert Behavior During Therapy: WFL for tasks assessed/performed Overall Cognitive Status: Within Functional Limits for tasks assessed                      General Comments      Exercises General Exercises - Lower Extremity Ankle Circles/Pumps: AROM;10 reps Quad Sets: Strengthening;10 reps Gluteal Sets: Strengthening;10 reps Heel Slides: Strengthening;10 reps Hip ABduction/ADduction: Strengthening;10 reps Straight Leg Raises: AROM;Strengthening;10 reps   Assessment/Plan    PT Assessment Patient needs continued PT services  PT Problem List Decreased strength;Decreased activity tolerance;Decreased balance;Decreased mobility;Decreased knowledge of use of DME;Decreased safety awareness;Pain;Decreased range of motion  PT Treatment Interventions DME instruction;Gait training;Stair training;Functional mobility training;Therapeutic activities;Therapeutic exercise;Balance training;Neuromuscular re-education;Patient/family education    PT Goals (Current goals can be found in the Care Plan section)  Acute Rehab PT Goals Patient Stated Goal: go home PT Goal Formulation: With patient Time For Goal Achievement: 08/12/16 Potential to Achieve Goals: Good    Frequency BID   Barriers to discharge        Co-evaluation                End of Session Equipment Utilized During Treatment: Gait belt Activity Tolerance: Patient tolerated treatment well Patient left: with chair alarm set;with call bell/phone within reach Nurse Communication: Mobility status         Time: [id number] PT Time Calculation (min) (ACUTE ONLY): 32 min   Charges:   PT Evaluation $PT Eval Low Complexity: 1 Procedure PT Treatments $Therapeutic Exercise: 8-22 mins   PT G Codes:        Terry ProGalen R Maldonado Maldonado, DPT 07/29/2016, 5:29 PM

## 2016-07-29 NOTE — Progress Notes (Signed)
appplied ice pack

## 2016-07-29 NOTE — Plan of Care (Signed)
Problem: Physical Regulation: Goal: Ability to maintain clinical measurements within normal limits will improve Outcome: Progressing Adequate post op pain control regimen

## 2016-07-29 NOTE — Op Note (Signed)
07/29/2016  9:39 AM  PATIENT:  Terry Maldonado  55 y.o. male  PRE-OPERATIVE DIAGNOSIS:  primary localized osteoarthritis  left hip  POST-OPERATIVE DIAGNOSIS:  primary localized osteoarthritis left hip  PROCEDURE:  Procedure(s): TOTAL HIP ARTHROPLASTY ANTERIOR APPROACH (Left)  SURGEON: Leitha SchullerMichael J Skylan Gift, MD  ASSISTANTS: None  ANESTHESIA:   general  EBL:  Total I/O In: 1000 [I.V.:1000] Out: 700 [Urine:300; Blood:400]  BLOOD ADMINISTERED:none  DRAINS: none   LOCAL MEDICATIONS USED:  BUPIVICAINE   SPECIMEN:  Source of Specimen:  Left femoral head  DISPOSITION OF SPECIMEN:  PATHOLOGY  COUNTS:  YES  TOURNIQUET:  * No tourniquets in log *  IMPLANTS: Medacta AMIS 3 standard stem with 56 mm Mpact cup DM with liner and L 28 mm head  DICTATION: .Dragon Dictation   The patient was brought to the operating room and after spinal anesthesia was obtained patient was placed on the operative table with the ipsilateral foot into the Medacta attachment, contralateral leg on a well-padded table. C-arm was brought in and preop template x-ray taken. After prepping and draping in usual sterile fashion appropriate patient identification and timeout procedures were completed. Anterior approach to the hip was obtained and centered over the greater trochanter and TFL muscle. The subcutaneous tissue was incised hemostasis being achieved by electrocautery. TFL fascia was incised and the muscle retracted laterally deep retractor placed. The lateral femoral circumflex vessels were identified and ligated. The anterior capsule was exposed and a capsulotomy performed. The neck was identified and a femoral neck cut carried out with a saw. The head was removed without difficulty and showed sclerotic femoral head and acetabulum. Reaming was carried out to 56 mm and a 56 mm cup trial gave appropriate tightness to the acetabular component a 56 DM cup was impacted into position. The leg was then externally rotated and  ischiofemoral and pubofemoral releases carried out. The femur was sequentially broached to a size 3, size 3 standard trials were placed and the final components chosen. The 3 standard stem was inserted along with a L 28 mm head and 56 mm liner. The hip was reduced and was stable the wound was thoroughly irrigated. The deep fascia was closed using a heavy Quill after infiltration of 30 cc of quarter percent Sensorcaine with epinephrine. 3-0 V-locl to close the skin with skin staples Xeroform plus honeycomb dressing applied  PLAN OF CARE: Admit to inpatient

## 2016-07-30 ENCOUNTER — Encounter: Payer: Self-pay | Admitting: Orthopedic Surgery

## 2016-07-30 MED ORDER — ENOXAPARIN SODIUM 40 MG/0.4ML ~~LOC~~ SOLN: 40.0000 mg | SUBCUTANEOUS | 0 refills | Status: AC

## 2016-07-30 MED ORDER — OXYCODONE HCL 5 MG PO TABS: 5.0000 mg | ORAL_TABLET | ORAL | 0 refills | Status: AC | PRN

## 2016-07-30 NOTE — Evaluation (Signed)
Occupational Therapy Evaluation Patient Details Name: Wolfgang Phoenixhillip E Flanagan MRN: [id number] DOB: 11/23/1960 Today's Date: 07/30/2016    History of Present Illness Pt. is a 55 y.o. male who was [facility] to [facility] for an Anterior Approach THR.   Clinical Impression   Pt. Is a 55 y.o. Male who [facility] approach TH[facility].  Pt. Presents with weakness, pain, and decreased functional mobility for ADLs. Pt. could benefit from skilled OT services for ADL training, A/E training, UE there. Ex, and functional mobility for ADLs in order to work towards improving ADLs, and returning to his PLOF. Pt. plans to to return home with wife assist upon discharge.    Follow Up Recommendations  No OT follow up    Equipment Recommendations       Recommendations for Other Services       Precautions / Restrictions Precautions Precautions: Anterior Hip Restrictions Weight Bearing Restrictions: Yes LLE Weight Bearing: Weight bearing as tolerated                                                     ADL Overall ADL's : Needs assistance/impaired Eating/Feeding: Set up   Grooming: Set up       Lower Body Bathing: Min guard                       Functional mobility during ADLs: Supervision/safety General ADL Comments: Pt. education was provided about A/E use for LE ADLs.     Vision     Perception     Praxis      Pertinent Vitals/Pain Pain Assessment: 0-10 Pain Score: 4      Hand Dominance Right   Extremity/Trunk Assessment Upper Extremity Assessment Upper Extremity Assessment: Overall WFL for tasks assessed           Communication Communication Communication: No difficulties   Cognition Arousal/Alertness: Awake/alert Behavior During Therapy: WFL for tasks assessed/performed Overall Cognitive Status: Within Functional Limits for tasks assessed                     General Comments       Exercises       Shoulder  Instructions      Home Living Family/patient expects to be discharged to:: Private residence Living Arrangements: Spouse/significant other Available Help at Discharge: Family   Home Access: Stairs to enter Secretary/administratorntrance Stairs-Number of Steps: 3 Entrance Stairs-Rails: Left;Right Home Layout: One level     Bathroom Shower/Tub: Estate manager/land agentWalk-in shower     Bathroom Accessibility: Yes   Home Equipment: Environmental consultantWalker - 2 wheels;Hand held shower head;Shower seat (crutches)          Prior Functioning/Environment Level of Independence: Independent  Pt. works in maintenance.                 OT Problem List: Decreased strength;Pain;Impaired UE functional use;Decreased activity tolerance;Decreased knowledge of use of DME or AE   OT Treatment/Interventions: Therapeutic exercise;Self-care/ADL training;Energy conservation;Visual/perceptual remediation/compensation;DME and/or AE instruction;Patient/family education;Therapeutic activities    OT Goals(Current goals can be found in the care plan section) Acute Rehab OT Goals Patient Stated Goal: To return home OT Goal Formulation: With patient Potential to Achieve Goals: Good  OT Frequency: Min 1X/week   Barriers to D/C:  Co-evaluation              End of Session    Activity Tolerance: Patient tolerated treatment well Patient left: with chair alarm set;in chair;with call bell/phone within reach   Time: 0920-0945 OT Time Calculation (min): 25 min Charges:  OT General Charges $OT Visit: 1 Procedure OT Evaluation $OT Eval Moderate Complexity: 1 Procedure OT Treatments $Self Care/Home Management : 8-22 mins G-Codes:    Olegario MessierElaine Mayline Dragon, MS, OTR/L 07/30/2016, 9:57 AM

## 2016-07-30 NOTE — Discharge Summary (Signed)
Physician Discharge Summary  Patient ID: Terry Maldonado MRN: [id number] DOB/AGE: 56/09/1960 56 y.o.  Admit date: 07/29/2016 Discharge date: 07/30/2016  Admission Diagnoses:  primary localized osteoarthritis   Discharge Diagnoses: Patient Active Problem List   Diagnosis Date Noted  . Primary localized osteoarthritis of left hip 07/29/2016    Past Medical History:  Diagnosis Date  . Anxiety   . Arthritis   . Chicken pox   . Hx of degenerative disc disease   . Hyperlipidemia   . Sleep apnea      Transfusion: none   Consultants (if any):   Discharged Condition: Improved  Hospital Course: Terry Maldonado is an 55 y.o. male who was admitted 07/29/2016 with a diagnosis of left hip osteoarthritis and went to the operating room on 07/29/2016 and underwent the above named procedures.    Surgeries: Procedure(s): TOTAL HIP ARTHROPLASTY ANTERIOR APPROACH on 07/29/2016 Patient tolerated the surgery well. Taken to PACU where she was stabilized and then transferred to the orthopedic floor.  Started on Lovenox 40 q 24 hrs. Foot pumps applied bilaterally at 80 mm. Heels elevated on bed with rolled towels. No evidence of DVT. Negative Homan. Physical therapy started on day #1 for gait training and transfer. OT started day #1 for ADL and assisted devices.  Patient's foley was d/c on day #1. Patient's IV  was d/c on day #1.   On post op day #1 patient was stable and ready for discharge to home with HHPT.  Implants: Medacta AMIS 3 standard stem with 56 mm Mpact cup DM with liner and L 28 mm head  He was given perioperative antibiotics:  Anti-infectives    Start     Dose/Rate Route Frequency Ordered Stop   07/29/16 1600  ceFAZolin (ANCEF) 3 g in dextrose 5 % 50 mL IVPB     3 g 130 mL/hr over 30 Minutes Intravenous Every 6 hours 07/29/16 1134 07/30/16 0513   07/29/16 0215  ceFAZolin (ANCEF) 3 g in dextrose 5 % 50 mL IVPB  Status:  Discontinued     3 g 130 mL/hr over 30 Minutes  Intravenous  Once 07/29/16 0208 07/29/16 1110    .  He was given sequential compression devices, early ambulation, and Lovenox for DVT prophylaxis.  He benefited maximally from the hospital stay and there were no complications.    Recent vital signs:  Vitals:   07/30/16 0448 07/30/16 0719  BP: (!) 106/56 123/69  Pulse: 70 66  Resp: 18 18  Temp: 98.2 F (36.8 C) 98.5 F (36.9 C)    Recent laboratory studies:  Lab Results  Component Value Date   HGB 13.5 07/29/2016   Lab Results  Component Value Date   WBC 19.9 (H) 07/29/2016   PLT 185 07/29/2016   Lab Results  Component Value Date   INR 0.99 07/21/2016   Lab Results  Component Value Date   CREATININE 0.90 07/29/2016    Discharge Medications:   Allergies as of 07/30/2016      Reactions   Neomycin Sulfate [neomycin] Other (See Comments)   Skin irritation & redness      Medication List    STOP taking these medications   aspirin EC 81 MG tablet     TAKE these medications   acetaminophen 500 MG tablet Commonly known as:  TYLENOL Take 500-1,000 mg by mouth every 6 (six) hours as needed (for pain.).   enoxaparin 40 MG/0.4ML injection Commonly known as:  LOVENOX Inject 0.4 mLs (40 mg  total) into the skin daily. Start taking on:  07/31/2016   escitalopram 10 MG tablet Commonly known as:  LEXAPRO Take 10 mg by mouth daily.   ibuprofen 800 MG tablet Commonly known as:  ADVIL,MOTRIN Take 800 mg by mouth every 8 (eight) hours as needed (for pain.).   multivitamin tablet Take 1 tablet by mouth daily.   Omega 3 1000 MG Caps Take 1,000 mg by mouth as directed. Typically once weekly on Friday or Saturday   oxyCODONE 5 MG immediate release tablet Commonly known as:  Oxy IR/ROXICODONE Take 1-2 tablets (5-10 mg total) by mouth every 3 (three) hours as needed for breakthrough pain.   traMADol-acetaminophen 37.5-325 MG tablet Commonly known as:  ULTRACET Take 1 tablet by mouth every 6 (six) hours as needed  (for pain.).            Durable Medical Equipment        Start     Ordered   07/29/16 1135  DME Walker rolling  Once    Question:  Patient needs a walker to treat with the following condition  Answer:  Status post total hip replacement, left   07/29/16 1134   07/29/16 1135  DME 3 n 1  Once     07/29/16 1134   07/29/16 1135  DME Bedside commode  Once    Question:  Patient needs a bedside commode to treat with the following condition  Answer:  Status post total hip replacement, left   07/29/16 1134      Diagnostic Studies: Dg Hip Operative Unilat W Or W/o Pelvis Left  Result Date: 07/29/2016 CLINICAL DATA:  Left hip replacement EXAM: OPERATIVE LEFT HIP WITH PELVIS COMPARISON:  None. FLUOROSCOPY TIME:  Radiation Exposure Index (as provided by the fluoroscopic device): 12.4 mGy If the device does not provide the exposure index: Fluoroscopy Time:  36 seconds Number of Acquired Images:  3 FINDINGS: A left hip prosthesis is noted in satisfactory position. No acute bony abnormality is noted. IMPRESSION: Status post left hip replacement. Electronically Signed   By: Alcide CleverMark  Lukens M.D.   On: 07/29/2016 09:46   Dg Hip Unilat W Or W/o Pelvis 2-3 Views Left  Result Date: 07/29/2016 CLINICAL DATA:  Status post left hip arthroplasty. EXAM: DG HIP (WITH OR WITHOUT PELVIS) 2-3V LEFT COMPARISON:  None. FINDINGS: Postoperative imaging shows normal alignment of a left hip arthroplasty. No evidence of fracture or abnormal lucency surrounding hardware. Postoperative air present in the adjacent soft tissues. IMPRESSION: Normal alignment of hardware status post total left hip arthroplasty. Electronically Signed   By: Irish LackGlenn  Yamagata M.D.   On: 07/29/2016 10:18    Disposition: 01-Home or Self Care       Signed: Patience MuscaGAINES, THOMAS CHRISTOPHER 07/30/2016, 12:56 PM

## 2016-07-30 NOTE — Progress Notes (Signed)
Clinical Social Worker (CSW) received SNF consult. PT is recommending home health. RN case manager is aware of above. Please reconsult if future social work needs arise. CSW signing off.   Zaydan Papesh, LCSW [phone] 

## 2016-07-30 NOTE — Anesthesia Postprocedure Evaluation (Signed)
Anesthesia Post Note  Patient: Wolfgang Phoenixhillip E Murad  Procedure(s) Performed: Procedure(s) (LRB): TOTAL HIP ARTHROPLASTY ANTERIOR APPROACH (Left)  Patient location during evaluation: Nursing Unit Anesthesia Type: General Level of consciousness: awake and alert Pain management: pain level controlled Vital Signs Assessment: post-procedure vital signs reviewed and stable Respiratory status: spontaneous breathing, nonlabored ventilation, respiratory function stable and patient connected to nasal cannula oxygen Cardiovascular status: blood pressure returned to baseline and stable Postop Assessment: no signs of nausea or vomiting Anesthetic complications: no    Last Vitals:  Vitals:   07/30/16 0448 07/30/16 0719  BP: (!) 106/56 123/69  Pulse: 70 66  Resp: 18 18  Temp: 36.8 C 36.9 C    Last Pain:  Vitals:   07/30/16 0719  TempSrc: Oral  PainSc:                  Starling Mannsurtis,  Marrissa Dai A

## 2016-07-30 NOTE — Progress Notes (Signed)
Physical Therapy Treatment Patient Details Name: Terry Maldonado MRN: [id number] DOB: 01/23/1961 Today's Date: 07/30/2016    History of Present Illness Pt is a 55 y/o male s/p total hip - anterior approach (12/14)    PT Comments    Pt is able to ambulate well with walker, negotiated up/down steps well and generally is progressing very nicely. He reports moderate pain that does not limit him and ultimately is better than expected POD1 expectations.  Pt motivated and doing well.   Follow Up Recommendations  Home health PT     Equipment Recommendations  Rolling walker with 5" wheels    Recommendations for Other Services       Precautions / Restrictions Precautions Precautions: Anterior Hip Restrictions Weight Bearing Restrictions: Yes LLE Weight Bearing: Weight bearing as tolerated    Mobility  Bed Mobility Overal bed mobility: Modified Independent             General bed mobility comments: Pt was able to rise to EOB without direct assist, showed good confidence  Transfers Overall transfer level: Modified independent Equipment used: Rolling walker (2 wheeled)             General transfer comment: Pt able to rise to standing with only minimal cuing for hand placement and sequencing  Ambulation/Gait Ambulation/Gait assistance: Supervision Ambulation Distance (Feet): 125 Feet Assistive device: Rolling walker (2 wheeled)       General Gait Details: Pt shows great confidence, consistency and overall showed great safety and essentially no limp with consistent forward walker movement   Stairs Stairs: Yes   Stair Management: One rail Left Number of Stairs: 4 General stair comments: Pt able to negotiate steps w/o issue, showing good safety and confidence  Wheelchair Mobility    Modified Rankin (Stroke Patients Only)       Balance Overall balance assessment: Modified Independent                                  Cognition  Arousal/Alertness: Awake/alert Behavior During Therapy: WFL for tasks assessed/performed Overall Cognitive Status: Within Functional Limits for tasks assessed                      Exercises Total Joint Exercises Ankle Circles/Pumps: AROM;10 reps Quad Sets: Strengthening;10 reps Gluteal Sets: Strengthening;10 reps Short Arc Quad: Strengthening;10 reps Heel Slides: Strengthening;10 reps Hip ABduction/ADduction: Strengthening;10 reps Straight Leg Raises: AROM;10 reps    General Comments        Pertinent Vitals/Pain Pain Assessment: 0-10 Pain Score: 6     Home Living Family/patient expects to be discharged to:: Private residence Living Arrangements: Spouse/significant other Available Help at Discharge: Family   Home Access: Stairs to enter Entrance Stairs-Rails: Left;Right Home Layout: One level Home Equipment: Environmental consultantWalker - 2 wheels;Hand held shower head;Shower seat (crutches)      Prior Function Level of Independence: Independent          PT Goals (current goals can now be found in the care plan section) Acute Rehab PT Goals Patient Stated Goal: To return home Progress towards PT goals: Progressing toward goals    Frequency    BID      PT Plan Current plan remains appropriate    Co-evaluation             End of Session Equipment Utilized During Treatment: Gait belt Activity Tolerance: Patient tolerated treatment well Patient left: with chair  alarm set;with call bell/phone within reach     Time: 0800-0826 PT Time Calculation (min) (ACUTE ONLY): 26 min  Charges:  $Gait Training: 8-22 mins $Therapeutic Exercise: 8-22 mins                    G Codes:      Malachi ProGalen R Latoiya Maradiaga, DPT 07/30/2016, 10:26 AM

## 2016-07-30 NOTE — Progress Notes (Signed)
Physical Therapy Treatment Patient Details Name: Terry Maldonado MRN: [id number] DOB: 10/09/1960 Today's Date: 07/30/2016    History of Present Illness Pt is a 55 y/o male s/p total hip - anterior approach (12/14)    PT Comments    Pt continues to show consistent improvement and great overall recovery.  He was able to circumambulate the nurses station w/o the walker ever stopping, he's independent with bed mobility and transfers, shows good overall strength with L hip flexion and generally in the L LE and should be able to return home safely w/o issue.   Follow Up Recommendations  Home health PT     Equipment Recommendations  Rolling walker with 5" wheels    Recommendations for Other Services       Precautions / Restrictions Precautions Precautions: Anterior Hip Restrictions LLE Weight Bearing: Weight bearing as tolerated    Mobility  Bed Mobility Overal bed mobility: Independent             General bed mobility comments: Pt able to swing LEs up into bed with good confidence but clearly with a lot of effort  Transfers Overall transfer level: Independent Equipment used: Rolling walker (2 wheeled)             General transfer comment: minimal cuing for hand placement but overall did well and showed good confidence/safety   Ambulation/Gait Ambulation/Gait assistance: Supervision Ambulation Distance (Feet): 200 Feet Assistive device: Rolling walker (2 wheeled)       General Gait Details: Pt shows great confidence, consistency and overall was safe with no limp and constant forward walker movement.  Pt with little fatigue, did have L thigh soreness with the effort.    Stairs            Wheelchair Mobility    Modified Rankin (Stroke Patients Only)       Balance Overall balance assessment: Modified Independent                                  Cognition Arousal/Alertness: Awake/alert Behavior During Therapy: WFL for tasks  assessed/performed Overall Cognitive Status: Within Functional Limits for tasks assessed                      Exercises Total Joint Exercises Ankle Circles/Pumps: AROM;10 reps Quad Sets: Strengthening;15 reps Gluteal Sets: Strengthening;15 reps Short Arc Quad: Strengthening;15 reps Heel Slides: Strengthening;10 reps Hip ABduction/ADduction: Strengthening;10 reps Straight Leg Raises: AAROM;AROM;10 reps    General Comments        Pertinent Vitals/Pain Pain Score: 5     Home Living                      Prior Function            PT Goals (current goals can now be found in the care plan section) Progress towards PT goals: Progressing toward goals    Frequency    BID      PT Plan Current plan remains appropriate    Co-evaluation             End of Session Equipment Utilized During Treatment: Gait belt Activity Tolerance: Patient tolerated treatment well Patient left: with call bell/phone within reach;with bed alarm set     Time: 1410-1435 PT Time Calculation (min) (ACUTE ONLY): 25 min  Charges:  $Gait Training: 8-22 mins $Therapeutic Exercise: 8-22 mins  G Codes:      Malachi ProGalen R Amare Kontos, DPT 07/30/2016, 3:34 PM

## 2016-07-30 NOTE — Care Management Note (Signed)
Case Management Note  Patient Details  Name: Terry Maldonado MRN: [id number] Date of Birth: 06/11/1961  Subjective/Objective:  Met with patient at bedside. He is sitting up in chair. Lives at home with his wife. Needs a walker. Ordered from [place]. PCP is Ramonita Lab. Pharmacy: [facility] [place] [phone]. Called Lovenox 40 mg # 14, no refills. Offered choice of home health agency: referral to [facility].                     Action/Plan: Walker to be delivered to room by [facility]. [facility]. Lovenox called in.   Expected Discharge Date:    07/30/2016              Expected Discharge Plan:  [place]  In-House Referral:     Discharge planning Services  CM Consult  Post Acute Care Choice:  Durable Medical Equipment, Home Health Choice offered to:  Patient  DME Arranged:  Walker rolling DME Agency:  [place]:  PT [place] Agency:  [facility] ([facility])  Status of Service:  Completed, signed off  If discussed at [employer] of Stay Meetings, dates discussed:    Additional Comments:  Jolly Mango, RN 07/30/2016, 12:19 PM

## 2016-07-30 NOTE — Discharge Instructions (Signed)

## 2016-07-30 NOTE — Progress Notes (Signed)
   Subjective: 1 Day Post-Op Procedure(s) (LRB): TOTAL HIP ARTHROPLASTY ANTERIOR APPROACH (Left) Patient reports pain as mild.   Patient is well, and has had no acute complaints or problems Denies any CP, SOB, ABD pain. We will continue therapy today.  Plan is to go Home after hospital stay.  Objective: Vital signs in last 24 hours: Temp:  [97.3 F (36.3 C)-98.5 F (36.9 C)] 98.5 F (36.9 C) (12/15 0719) Pulse Rate:  [54-75] 66 (12/15 0719) Resp:  [13-26] 18 (12/15 0719) BP: (106-150)/(56-123) 123/69 (12/15 0719) SpO2:  [92 %-100 %] 96 % (12/15 0719) FiO2 (%):  [21 %] 21 % (12/14 1135)  Intake/Output from previous day: 12/14 0701 - 12/15 0700 In: 2868.3 [I.V.:2718.3; IV Piggyback:150] Out: 4700 [Urine:4300; Blood:400] Intake/Output this shift: No intake/output data recorded.   Recent Labs  07/29/16 1158  HGB 13.5    Recent Labs  07/29/16 1158  WBC 19.9*  RBC 4.51  HCT 39.3*  PLT 185    Recent Labs  07/29/16 1158  CREATININE 0.90   No results for input(s): LABPT, INR in the last 72 hours.  EXAM General - Patient is Alert, Appropriate and Oriented Extremity - Neurovascular intact Sensation intact distally Intact pulses distally Dorsiflexion/Plantar flexion intact No cellulitis present Compartment soft Dressing - dressing C/D/I and no drainage Motor Function - intact, moving foot and toes well on exam.   Past Medical History:  Diagnosis Date  . Anxiety   . Arthritis   . Chicken pox   . Hx of degenerative disc disease   . Hyperlipidemia   . Sleep apnea     Assessment/Plan:   1 Day Post-Op Procedure(s) (LRB): TOTAL HIP ARTHROPLASTY ANTERIOR APPROACH (Left) Active Problems:   Primary localized osteoarthritis of left hip  Estimated body mass index is 39.23 kg/m as calculated from the following:   Height as of this encounter: 5\' 8"  (1.727 m).   Weight as of this encounter: 117 kg (258 lb). Advance diet Up with therapy  Needs BM CM to  assist with discharge Recheck labs   DVT Prophylaxis - Lovenox, Foot Pumps and TED hose Weight-Bearing as tolerated to left leg   T. Cranston Neighborhris Danial Hlavac, PA-C [facility] 07/30/2016, 7:40 AM

## 2016-07-31 LAB — CBC
HEMATOCRIT: 35.3 % — AB (ref 40.0–52.0)
HEMOGLOBIN: 12.3 g/dL — AB (ref 13.0–18.0)
MCH: 30.5 pg (ref 26.0–34.0)
MCHC: 34.9 g/dL (ref 32.0–36.0)
MCV: 87.3 fL (ref 80.0–100.0)
Platelets: 165 10*3/uL (ref 150–440)
RBC: 4.05 MIL/uL — ABNORMAL LOW (ref 4.40–5.90)
RDW: 13.5 % (ref 11.5–14.5)
WBC: 13.1 10*3/uL — ABNORMAL HIGH (ref 3.8–10.6)

## 2016-07-31 LAB — BASIC METABOLIC PANEL
Anion gap: 3 — ABNORMAL LOW (ref 5–15)
BUN: 9 mg/dL (ref 6–20)
CHLORIDE: 106 mmol/L (ref 101–111)
CO2: 29 mmol/L (ref 22–32)
CREATININE: 0.8 mg/dL (ref 0.61–1.24)
Calcium: 8.2 mg/dL — ABNORMAL LOW (ref 8.9–10.3)
GFR calc non Af Amer: >60 mL/min (ref >60)
GLUCOSE: 134 mg/dL — AB (ref 65–99)
Potassium: 3.3 mmol/L — ABNORMAL LOW (ref 3.5–5.1)
Sodium: 138 mmol/L (ref 135–145)

## 2016-07-31 MED ORDER — POTASSIUM CHLORIDE 20 MEQ PO PACK
40.0000 meq | PACK | Freq: Once | ORAL | Status: AC
  Administered 2016-07-31: 40 meq via ORAL
  Filled 2016-07-31: qty 2

## 2016-07-31 NOTE — Progress Notes (Signed)
Physical Therapy Treatment Patient Details Name: Terry Maldonado MRN: [id number] DOB: 03/24/1961 Today's Date: 07/31/2016    History of Present Illness Pt is a 55 y/o male s/p total hip - anterior approach (12/14)    PT Comments    Pt in chair upon arrival.  Was able to stand and ambulate around nursing unit x 1 this am with good gait quality and safety.  Does well with turns and in room navigation.  Participated in exercises as described below. Anticipate discharge to home today.  Pt with no further questions or concerns.  Has HEP.   Follow Up Recommendations  Home health PT     Equipment Recommendations  Rolling walker with 5" wheels    Recommendations for Other Services       Precautions / Restrictions Precautions Precautions: Anterior Hip Restrictions Weight Bearing Restrictions: Yes LLE Weight Bearing: Weight bearing as tolerated    Mobility  Bed Mobility                  Transfers Overall transfer level: Independent Equipment used: Rolling walker (2 wheeled)             General transfer comment: no cueing needed today for hand placements  Ambulation/Gait Ambulation/Gait assistance: Supervision Ambulation Distance (Feet): 200 Feet Assistive device: Rolling walker (2 wheeled)       General Gait Details: generally steady and safe with gait   Stairs         General stair comments: reported feeling confident with stairs and declined today  Wheelchair Mobility    Modified Rankin (Stroke Patients Only)       Balance Overall balance assessment: Modified Independent                                  Cognition Arousal/Alertness: Awake/alert Behavior During Therapy: WFL for tasks assessed/performed Overall Cognitive Status: Within Functional Limits for tasks assessed                      Exercises Total Joint Exercises Ankle Circles/Pumps: AROM;10 reps;Seated Heel Slides: AROM;Left;10 reps;Seated Hip  ABduction/ADduction: AROM;Left;Standing;20 reps Straight Leg Raises: AROM;Left;Standing;20 reps Long Arc Quad: AROM;Left;Seated;10 reps Marching in Standing: AROM;Left;Standing;20 reps    General Comments        Pertinent Vitals/Pain Pain Assessment: 0-10 Pain Score: 4  Pain Descriptors / Indicators: Sore Pain Intervention(s): Limited activity within patient's tolerance;Monitored during session;Ice applied    Home Living                      Prior Function            PT Goals (current goals can now be found in the care plan section) Progress towards PT goals: Progressing toward goals    Frequency    BID      PT Plan Current plan remains appropriate    Co-evaluation             End of Session Equipment Utilized During Treatment: Gait belt Activity Tolerance: Patient tolerated treatment well Patient left: in chair;with call bell/phone within reach     Time: 0915-0928 PT Time Calculation (min) (ACUTE ONLY): 13 min  Charges:  $Gait Training: 8-22 mins $Therapeutic Exercise: 8-22 mins                    G Codes:      Danielle DessSarah Makaylah Oddo 07/31/2016, 10:25  AM

## 2016-07-31 NOTE — Progress Notes (Addendum)
   Subjective: 2 Days Post-Op Procedure(s) (LRB): TOTAL HIP ARTHROPLASTY ANTERIOR APPROACH (Left) Patient reports pain as mild.   Patient is well, and has had no acute complaints or problems Denies any CP, SOB, ABD pain. We will continue therapy today.  Plan is to go Home after hospital stay.  Objective: Vital signs in last 24 hours: Temp:  [98.1 F (36.7 C)-100.9 F (38.3 C)] 98.6 F (37 C) (12/16 0757) Pulse Rate:  [64-90] 72 (12/16 0757) Resp:  [18-20] 20 (12/16 0518) BP: (122-149)/(59-78) 149/78 (12/16 0757) SpO2:  [96 %-100 %] 100 % (12/16 0757)  Intake/Output from previous day: 12/15 0701 - 12/16 0700 In: 720 [P.O.:720] Out: 2000 [Urine:2000] Intake/Output this shift: No intake/output data recorded.   Recent Labs  07/29/16 1158 07/31/16 0354  HGB 13.5 12.3*    Recent Labs  07/29/16 1158 07/31/16 0354  WBC 19.9* 13.1*  RBC 4.51 4.05*  HCT 39.3* 35.3*  PLT 185 165    Recent Labs  07/29/16 1158 07/31/16 0354  NA  --  138  K  --  3.3*  CL  --  106  CO2  --  29  BUN  --  9  CREATININE 0.90 0.80  GLUCOSE  --  134*  CALCIUM  --  8.2*   No results for input(s): LABPT, INR in the last 72 hours.  EXAM General - Patient is Alert, Appropriate and Oriented Extremity - Neurovascular intact Sensation intact distally Intact pulses distally Dorsiflexion/Plantar flexion intact No cellulitis present Compartment soft Dressing - dressing C/D/I and no drainage Motor Function - intact, moving foot and toes well on exam.   Past Medical History:  Diagnosis Date  . Anxiety   . Arthritis   . Chicken pox   . Hx of degenerative disc disease   . Hyperlipidemia   . Sleep apnea     Assessment/Plan:   2 Days Post-Op Procedure(s) (LRB): TOTAL HIP ARTHROPLASTY ANTERIOR APPROACH (Left) Active Problems:   Primary localized osteoarthritis of left hip    Estimated body mass index is 39.23 kg/m as calculated from the following:   Height as of this encounter: 5'  8" (1.727 m).   Weight as of this encounter: 117 kg (258 lb). Advance diet Up with therapy  Acute post op blood loss anemia - mild, Hgb stable Hypokalemia - K 3.3, Klor kon 40 meq once today Low grade fever last night, continue with incentive spirometer. Incision site appears well with no signs of infection. Plan on discharge to home with HHPT after PT today. Follow up with [facility] in 2 weeks for staple removal.  DVT Prophylaxis - Lovenox, Foot Pumps and TED hose Weight-Bearing as tolerated to left leg   T. Cranston Neighborhris Lititia Sen, PA-C [facility] 07/31/2016, 8:19 AM

## 2016-07-31 NOTE — Care Management Note (Signed)
Case Management Note  Patient Details  Name: Wolfgang Phoenixhillip E Bernstein MRN: [id number] Date of Birth: 03/14/1961  Subjective/Objective:    A referral for HH-PT was called to Shea Stakesona George at [facility] today. Mr Henreitta LeberBridges has DME equipment from [facility].. [facility]mh x 14 [facility] [place] [facility]. Discharge to home.                 Action/Plan:   Expected Discharge Date:                  Expected Discharge Plan:  Home w Home Health Services  In-House Referral:     Discharge planning Services  CM Consult  Post Acute Care Choice:  Durable Medical Equipment, Home Health Choice offered to:  Patient  DME Arranged:  Walker rolling DME Agency:  [facility]  HH Arranged:  PT HH Agency:  [facility]  Status of Service:  Completed, signed off  If discussed at [employer] of Stay Meetings, dates discussed:    Additional Comments:  Jacque Garrels A, RN 07/31/2016, 8:29 AM

## 2016-08-02 LAB — SURGICAL PATHOLOGY

## 2017-12-10 IMAGING — DX DG HIP (WITH OR WITHOUT PELVIS) 2-3V*L*
2 series · 2 of 2 positions shown · non-contrast
Comparison: None.

CLINICAL DATA: Status post left hip arthroplasty.

EXAM:
DG HIP (WITH OR WITHOUT PELVIS) 2-3V LEFT

[pelvis ap]
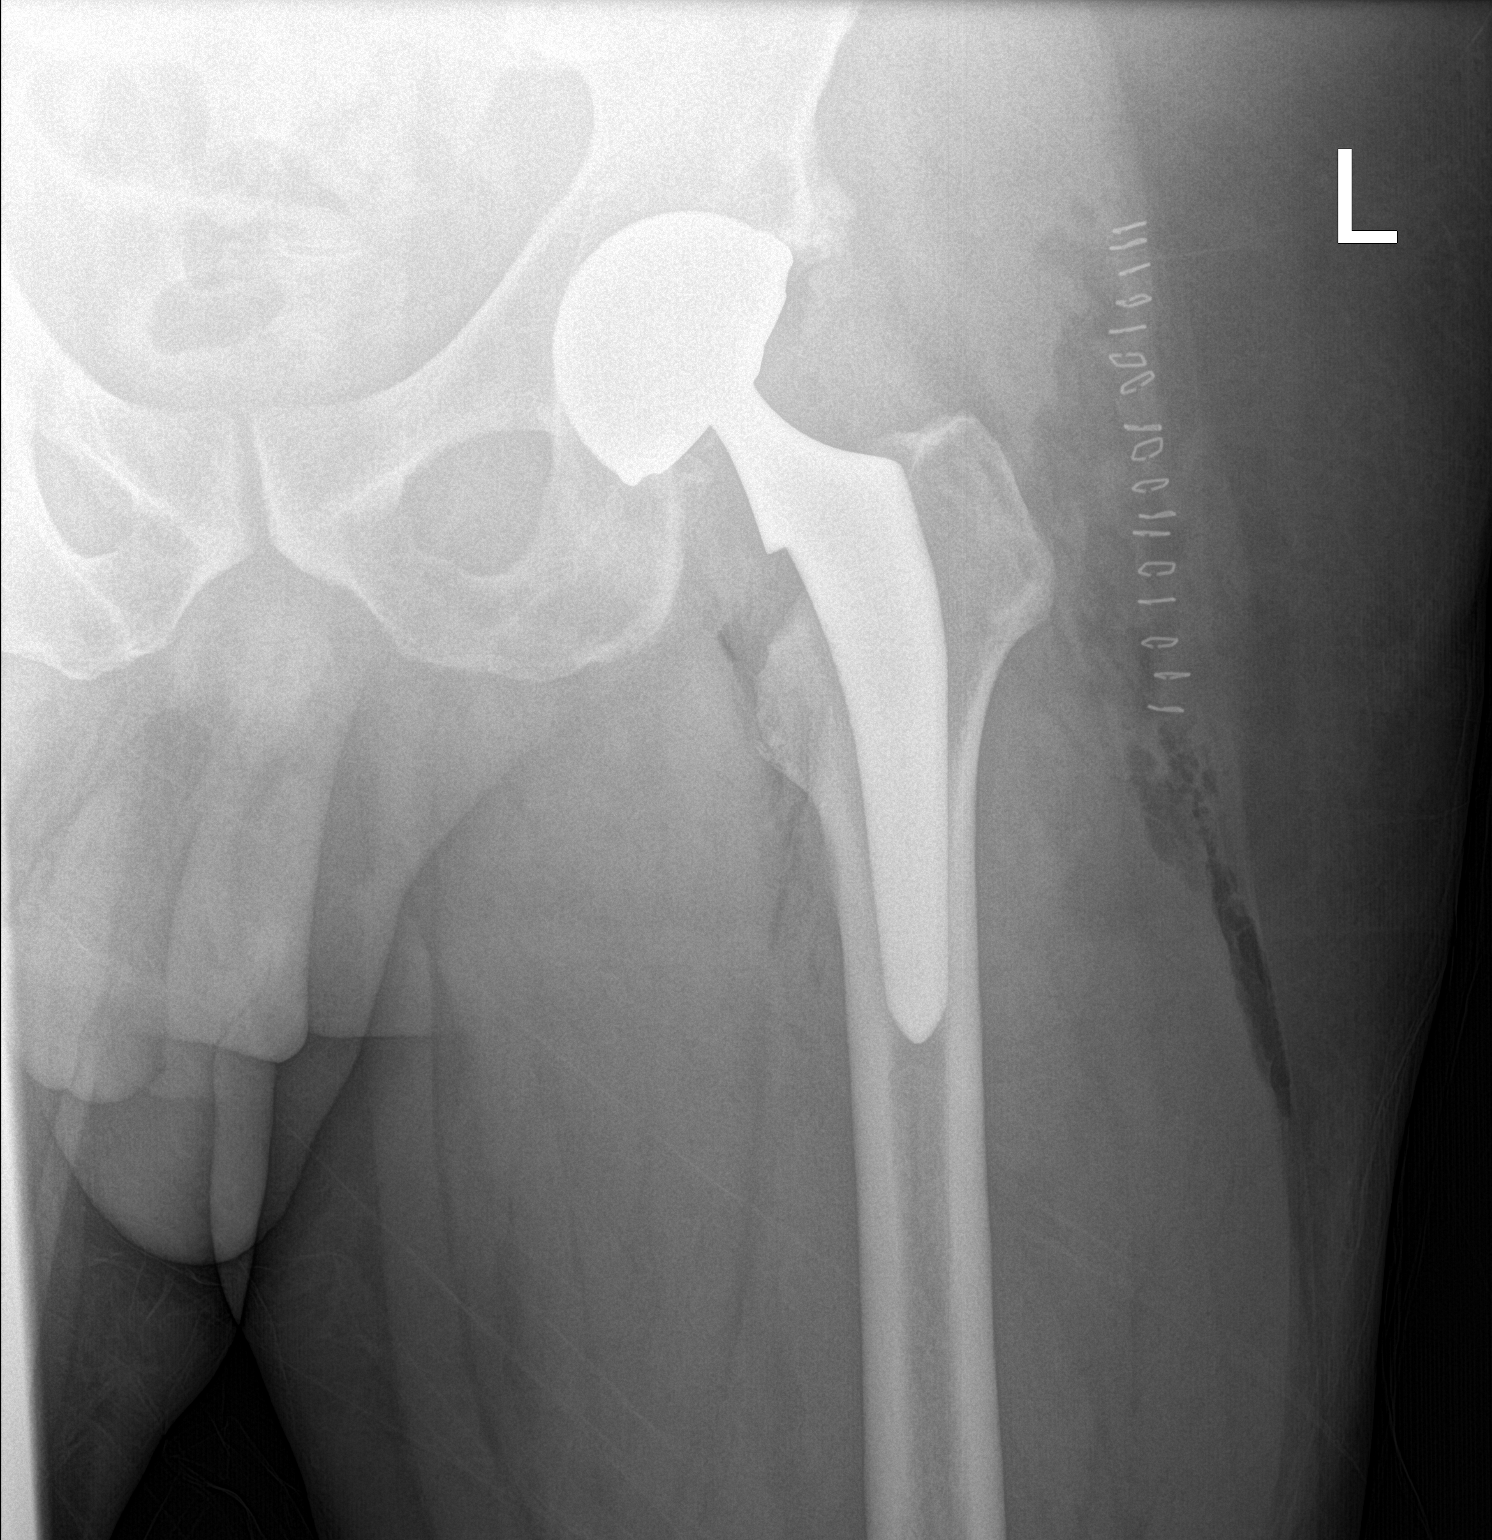

[hip lat]
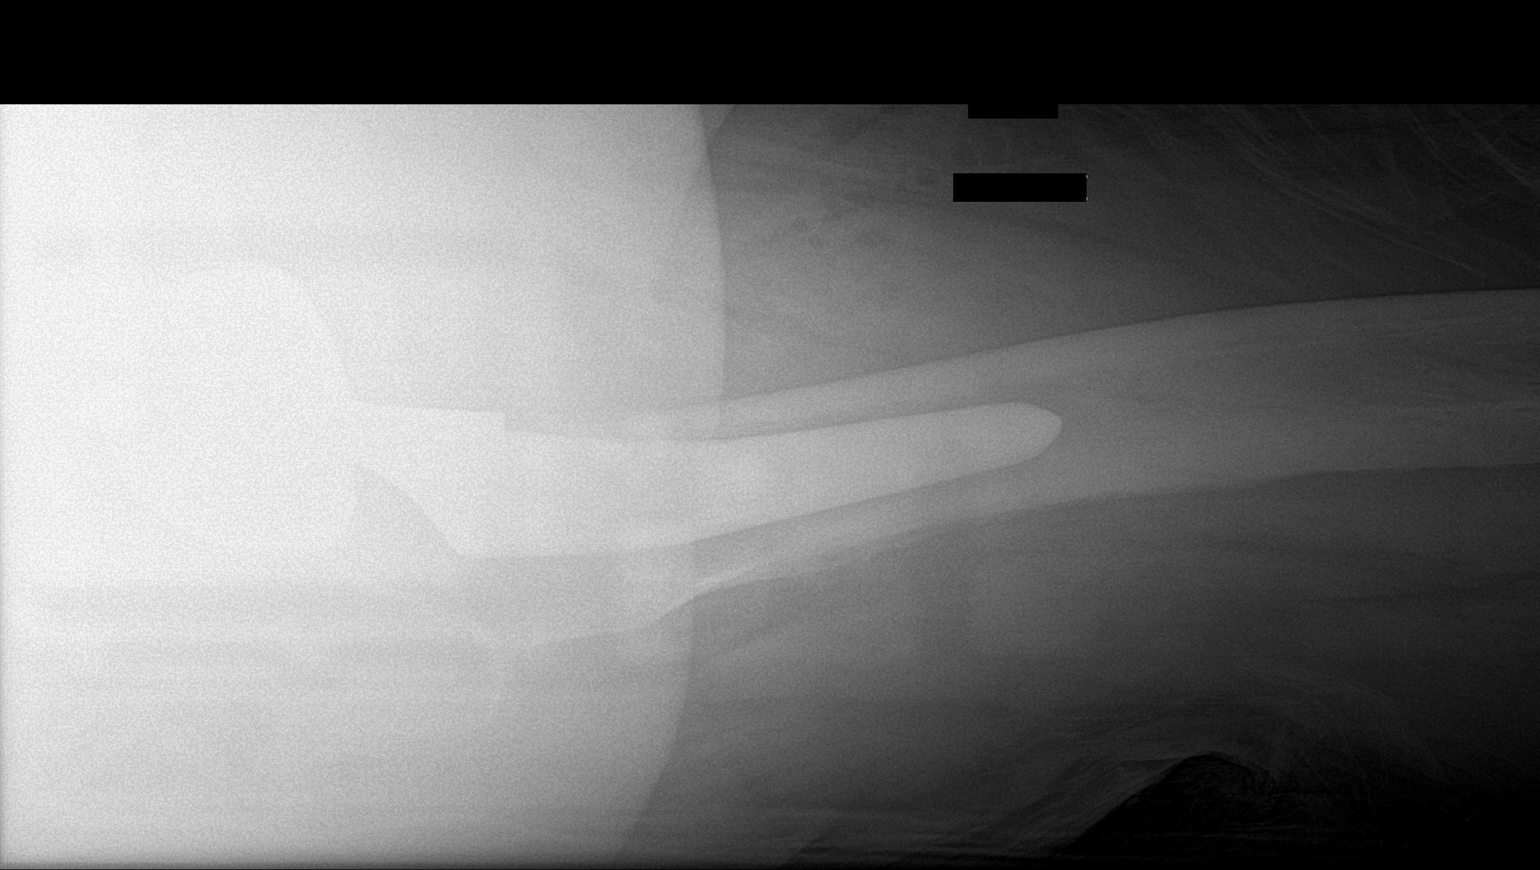

[2 of 2 positions shown; findings below may reference images not displayed]

FINDINGS: Postoperative imaging shows normal alignment of a left hip
arthroplasty. No evidence of fracture or abnormal lucency
surrounding hardware. Postoperative air present in the adjacent soft
tissues.
IMPRESSION: Normal alignment of hardware status post total left hip
arthroplasty.

## 2017-12-10 IMAGING — XA DG HIP (WITH PELVIS) OPERATIVE*L*
3 series · 3 of 3 positions shown · non-contrast
Comparison: None.

CLINICAL DATA: Left hip replacement

EXAM:
OPERATIVE LEFT HIP WITH PELVIS

[Series 1: ortho standard · 1 of 1 slices shown (1 of 3)]
[im 1/1]
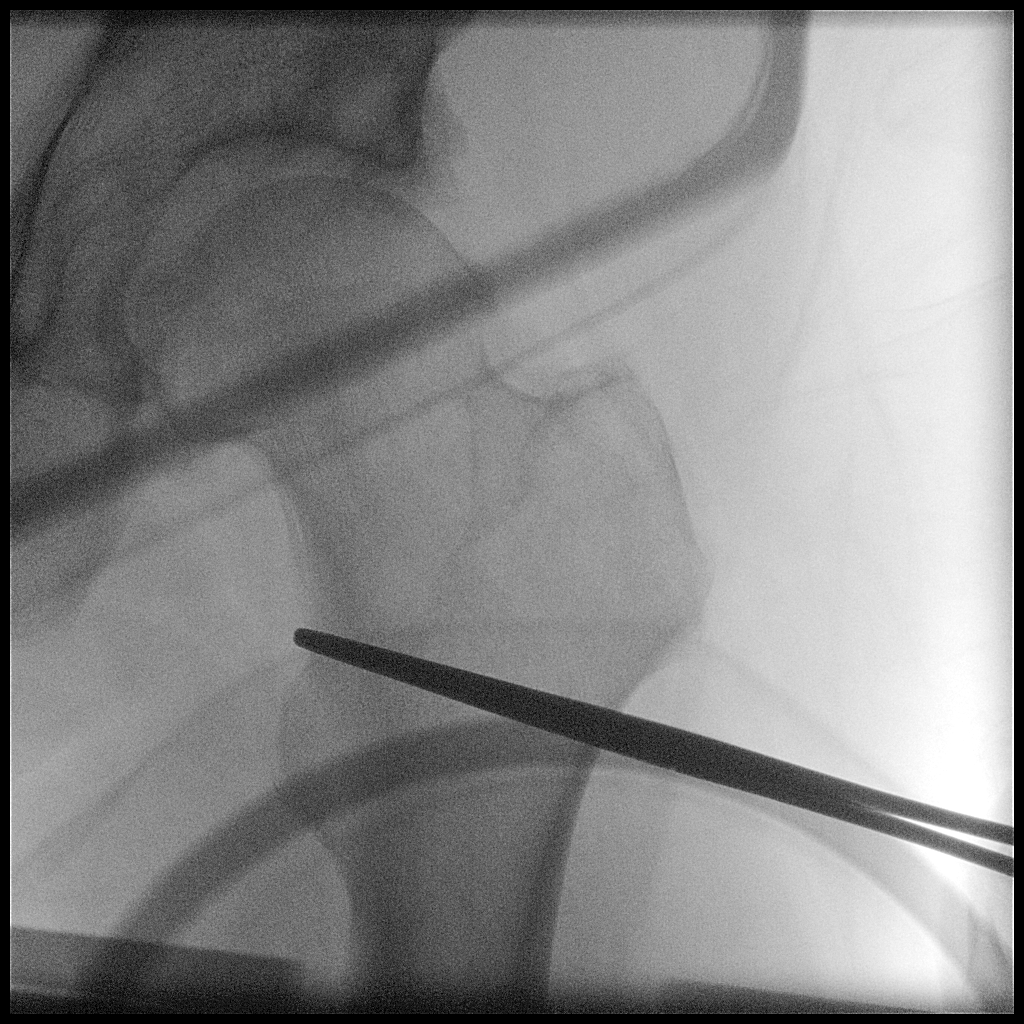

[Series 8: ortho standard · 1 of 1 slices shown (2 of 3)]
[im 1/1]
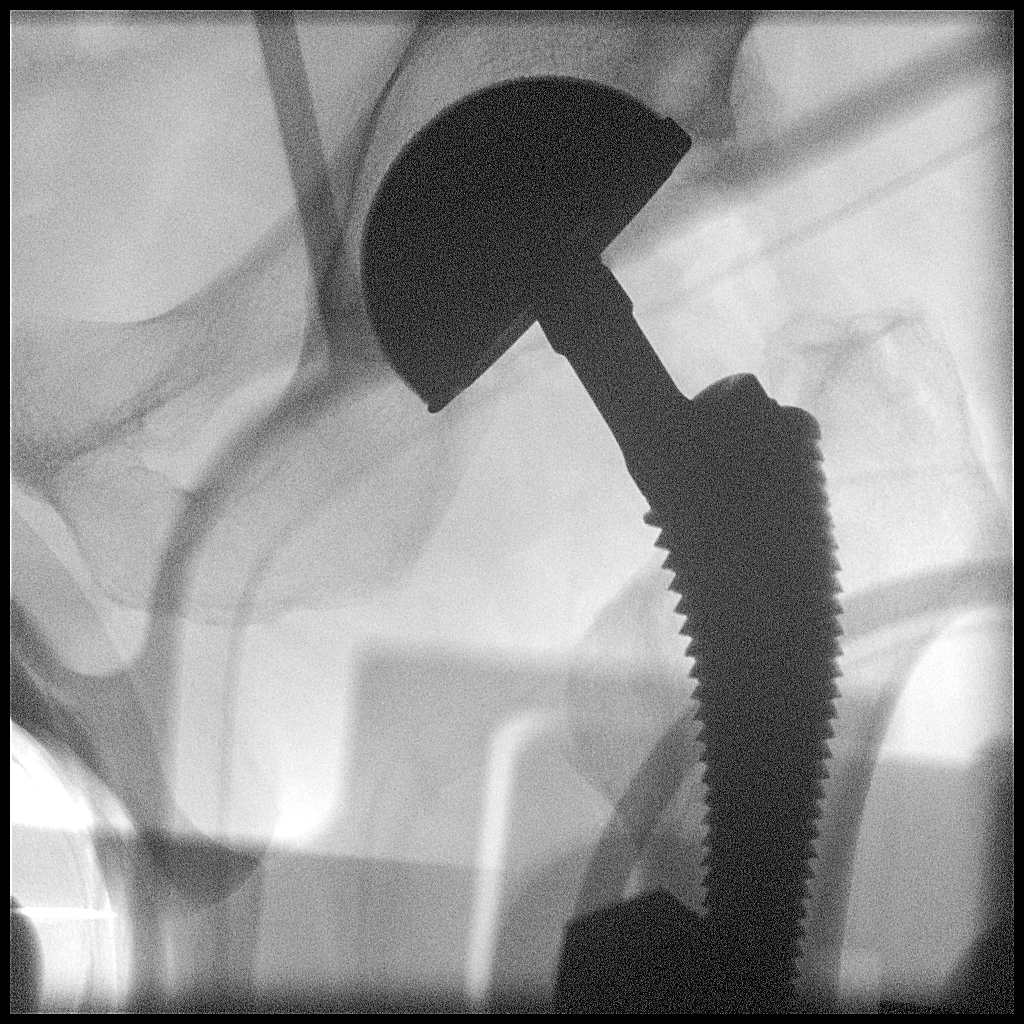

[Series 10: ortho standard · 1 of 1 slices shown (3 of 3)]
[im 1/1]
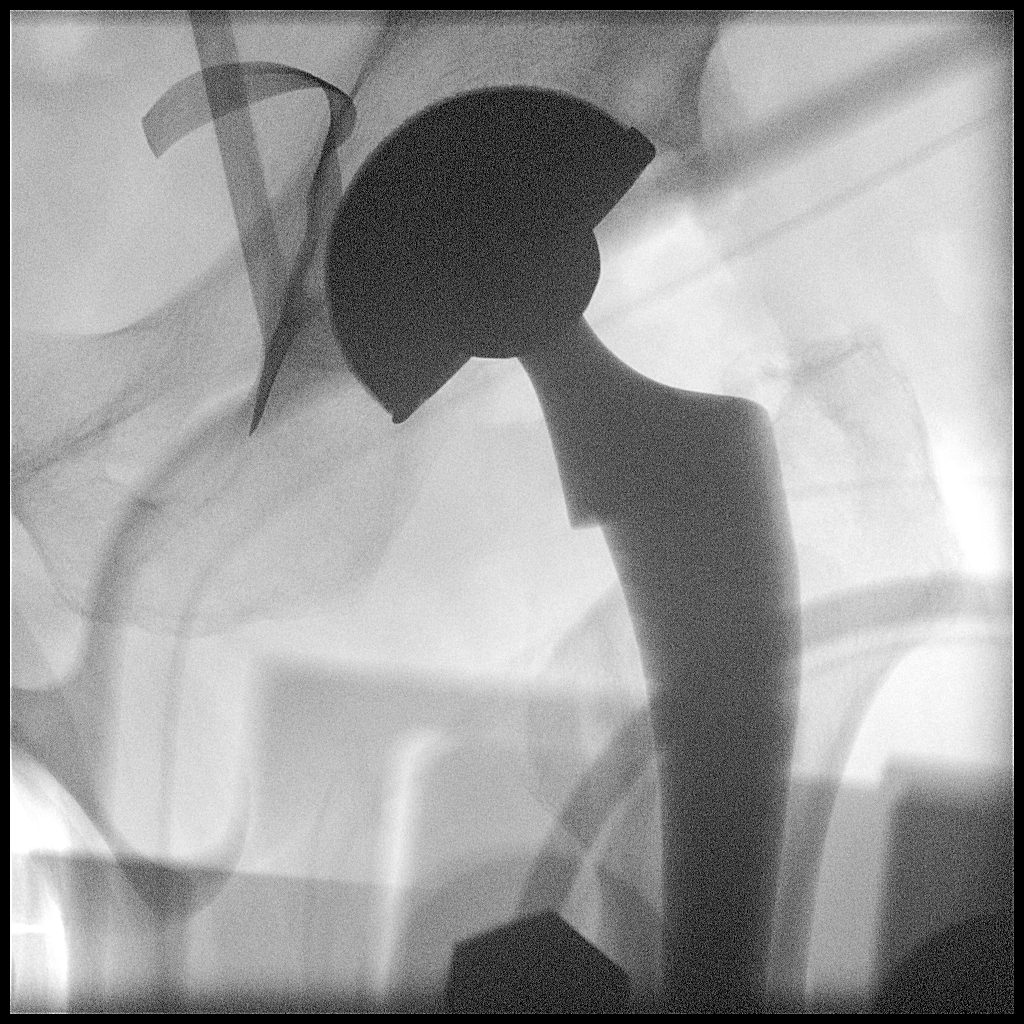

[3 of 3 positions shown; findings below may reference images not displayed]

FLUOROSCOPY TIME:  Radiation Exposure Index (as provided by the
fluoroscopic device): 12.4 mGy

If the device does not provide the exposure index:

Fluoroscopy Time:  36 seconds

Number of Acquired Images:  3
FINDINGS: A left hip prosthesis is noted in satisfactory position. No acute
bony abnormality is noted.
IMPRESSION: Status post left hip replacement.

## 2023-09-06 ENCOUNTER — Other Ambulatory Visit: Payer: Self-pay | Admitting: Internal Medicine

## 2023-09-06 DIAGNOSIS — E7849 Other hyperlipidemia: Secondary | ICD-10-CM

## 2023-09-06 DIAGNOSIS — Z0000 Encounter for general adult medical examination without abnormal findings: Secondary | ICD-10-CM

## 2023-09-23 ENCOUNTER — Ambulatory Visit
Admission: RE | Admit: 2023-09-23 | Discharge: 2023-09-23 | Disposition: A | Discharge status: Home / Self Care | Payer: Self-pay | Source: Ambulatory Visit | Attending: Internal Medicine | Admitting: Internal Medicine

## 2023-09-23 DIAGNOSIS — Z0000 Encounter for general adult medical examination without abnormal findings: Secondary | ICD-10-CM

## 2023-09-23 DIAGNOSIS — E7849 Other hyperlipidemia: Secondary | ICD-10-CM

## 2023-09-27 ENCOUNTER — Other Ambulatory Visit: Payer: Self-pay
